# Patient Record
Sex: Female | Born: 1955 | Race: White | Hispanic: No | Smoking: Former smoker
Health system: Southern US, Community
[De-identification: ages and names within clinical notes are randomized; demographics above are authoritative.]

## PROBLEM LIST (undated history)

## (undated) ENCOUNTER — Emergency Department (HOSPITAL_COMMUNITY): Admission: EM | Payer: Medicare Other | Source: Home / Self Care

## (undated) DIAGNOSIS — B1920 Unspecified viral hepatitis C without hepatic coma: Secondary | ICD-10-CM

## (undated) DIAGNOSIS — N289 Disorder of kidney and ureter, unspecified: Secondary | ICD-10-CM

## (undated) DIAGNOSIS — S065XAA Traumatic subdural hemorrhage with loss of consciousness status unknown, initial encounter: Secondary | ICD-10-CM

## (undated) DIAGNOSIS — M069 Rheumatoid arthritis, unspecified: Secondary | ICD-10-CM

## (undated) DIAGNOSIS — A6920 Lyme disease, unspecified: Secondary | ICD-10-CM

## (undated) DIAGNOSIS — K279 Peptic ulcer, site unspecified, unspecified as acute or chronic, without hemorrhage or perforation: Secondary | ICD-10-CM

## (undated) DIAGNOSIS — K7460 Unspecified cirrhosis of liver: Secondary | ICD-10-CM

## (undated) DIAGNOSIS — S065X9A Traumatic subdural hemorrhage with loss of consciousness of unspecified duration, initial encounter: Secondary | ICD-10-CM

## (undated) HISTORY — PX: JOINT REPLACEMENT: SHX530

---

## 2010-05-18 ENCOUNTER — Encounter (INDEPENDENT_AMBULATORY_CARE_PROVIDER_SITE_OTHER): Payer: Self-pay | Admitting: Internal Medicine

## 2010-12-01 ENCOUNTER — Inpatient Hospital Stay (HOSPITAL_COMMUNITY): Admission: EM | Admit: 2010-12-01 | Discharge: 2010-05-23 | Payer: Self-pay | Admitting: Emergency Medicine

## 2011-03-07 ENCOUNTER — Encounter (HOSPITAL_COMMUNITY)
Admission: RE | Admit: 2011-03-07 | Discharge: 2011-03-07 | Disposition: A | Discharge status: Home / Self Care | Payer: Medicare Other | Source: Ambulatory Visit | Attending: Orthopedic Surgery | Admitting: Orthopedic Surgery

## 2011-03-07 LAB — COMPREHENSIVE METABOLIC PANEL
AST: 122 U/L — ABNORMAL HIGH (ref 0–37)
Alkaline Phosphatase: 90 U/L (ref 39–117)
CO2: 26 mEq/L (ref 19–32)
Chloride: 104 mEq/L (ref 96–112)
GFR calc Af Amer: >60 mL/min (ref >60)
GFR calc non Af Amer: >60 mL/min (ref >60)
Potassium: 4.7 mEq/L (ref 3.5–5.1)
Total Bilirubin: 2 mg/dL — ABNORMAL HIGH (ref 0.3–1.2)
Total Protein: 7.7 g/dL (ref 6.0–8.3)

## 2011-03-07 LAB — CBC: RDW: 14.6 % (ref 11.5–15.5)

## 2011-03-07 LAB — PROTIME-INR
INR: 1.19 (ref 0.00–1.49)
Prothrombin Time: 15.3 seconds — ABNORMAL HIGH (ref 11.6–15.2)

## 2011-03-13 LAB — BASIC METABOLIC PANEL
CO2: 24 mEq/L (ref 19–32)
Calcium: 8.9 mg/dL (ref 8.4–10.5)
Chloride: 110 mEq/L (ref 96–112)
GFR calc Af Amer: >60 mL/min (ref >60)
Sodium: 139 mEq/L (ref 135–145)

## 2011-03-13 LAB — COMPREHENSIVE METABOLIC PANEL
ALT: 16 U/L (ref 0–35)
ALT: 17 U/L (ref 0–35)
Albumin: 2.6 g/dL — ABNORMAL LOW (ref 3.5–5.2)
Alkaline Phosphatase: 51 U/L (ref 39–117)
Alkaline Phosphatase: 57 U/L (ref 39–117)
Alkaline Phosphatase: 65 U/L (ref 39–117)
Alkaline Phosphatase: 79 U/L (ref 39–117)
BUN: 14 mg/dL (ref 6–23)
BUN: 3 mg/dL — ABNORMAL LOW (ref 6–23)
BUN: 42 mg/dL — ABNORMAL HIGH (ref 6–23)
Calcium: 7.9 mg/dL — ABNORMAL LOW (ref 8.4–10.5)
Calcium: 8.2 mg/dL — ABNORMAL LOW (ref 8.4–10.5)
Calcium: 8.7 mg/dL (ref 8.4–10.5)
Calcium: 9.3 mg/dL (ref 8.4–10.5)
Creatinine, Ser: 0.45 mg/dL (ref 0.4–1.2)
Creatinine, Ser: 0.55 mg/dL (ref 0.4–1.2)
Creatinine, Ser: 0.8 mg/dL (ref 0.4–1.2)
GFR calc non Af Amer: >60 mL/min (ref >60)
Glucose, Bld: 128 mg/dL — ABNORMAL HIGH (ref 70–99)
Glucose, Bld: 83 mg/dL (ref 70–99)
Glucose, Bld: 98 mg/dL (ref 70–99)
Potassium: 3.8 mEq/L (ref 3.5–5.1)
Potassium: 4.1 mEq/L (ref 3.5–5.1)
Potassium: 4.1 mEq/L (ref 3.5–5.1)
Sodium: 137 mEq/L (ref 135–145)
Sodium: 137 mEq/L (ref 135–145)
Total Bilirubin: 1.4 mg/dL — ABNORMAL HIGH (ref 0.3–1.2)
Total Protein: 5.9 g/dL — ABNORMAL LOW (ref 6.0–8.3)
Total Protein: 5.9 g/dL — ABNORMAL LOW (ref 6.0–8.3)
Total Protein: 6.4 g/dL (ref 6.0–8.3)
Total Protein: 8.2 g/dL (ref 6.0–8.3)

## 2011-03-13 LAB — DIFFERENTIAL
Basophils Relative: 1 % (ref 0–1)
Lymphocytes Relative: 32 % (ref 12–46)
Lymphs Abs: 3 10*3/uL (ref 0.7–4.0)
Monocytes Absolute: 1.4 10*3/uL — ABNORMAL HIGH (ref 0.1–1.0)
Neutro Abs: 5 10*3/uL (ref 1.7–7.7)
Neutro Abs: 7.7 10*3/uL (ref 1.7–7.7)
Neutrophils Relative %: 62 % (ref 43–77)

## 2011-03-13 LAB — URINALYSIS, ROUTINE W REFLEX MICROSCOPIC
Glucose, UA: NEGATIVE mg/dL
Ketones, ur: NEGATIVE mg/dL
Protein, ur: NEGATIVE mg/dL
Urobilinogen, UA: 1 mg/dL (ref 0.0–1.0)

## 2011-03-13 LAB — URINE CULTURE

## 2011-03-13 LAB — TYPE AND SCREEN

## 2011-03-13 LAB — CBC
HCT: 24.2 % — ABNORMAL LOW (ref 36.0–46.0)
HCT: 24.8 % — ABNORMAL LOW (ref 36.0–46.0)
HCT: 25.2 % — ABNORMAL LOW (ref 36.0–46.0)
HCT: 25.7 % — ABNORMAL LOW (ref 36.0–46.0)
HCT: 26.4 % — ABNORMAL LOW (ref 36.0–46.0)
HCT: 27.1 % — ABNORMAL LOW (ref 36.0–46.0)
HCT: 30.3 % — ABNORMAL LOW (ref 36.0–46.0)
Hemoglobin: 10 g/dL — ABNORMAL LOW (ref 12.0–15.0)
Hemoglobin: 8.3 g/dL — ABNORMAL LOW (ref 12.0–15.0)
Hemoglobin: 8.7 g/dL — ABNORMAL LOW (ref 12.0–15.0)
Hemoglobin: 8.8 g/dL — ABNORMAL LOW (ref 12.0–15.0)
Hemoglobin: 8.9 g/dL — ABNORMAL LOW (ref 12.0–15.0)
Hemoglobin: 9 g/dL — ABNORMAL LOW (ref 12.0–15.0)
Hemoglobin: 9.3 g/dL — ABNORMAL LOW (ref 12.0–15.0)
MCHC: 33 g/dL (ref 30.0–36.0)
MCHC: 33.3 g/dL (ref 30.0–36.0)
MCHC: 33.3 g/dL (ref 30.0–36.0)
MCHC: 33.8 g/dL (ref 30.0–36.0)
MCHC: 34.1 g/dL (ref 30.0–36.0)
MCHC: 34.1 g/dL (ref 30.0–36.0)
MCHC: 34.1 g/dL (ref 30.0–36.0)
MCHC: 34.7 g/dL (ref 30.0–36.0)
MCV: 102 fL — ABNORMAL HIGH (ref 78.0–100.0)
MCV: 102.9 fL — ABNORMAL HIGH (ref 78.0–100.0)
MCV: 103.5 fL — ABNORMAL HIGH (ref 78.0–100.0)
MCV: 104 fL — ABNORMAL HIGH (ref 78.0–100.0)
MCV: 104.5 fL — ABNORMAL HIGH (ref 78.0–100.0)
MCV: 105.2 fL — ABNORMAL HIGH (ref 78.0–100.0)
Platelets: 102 10*3/uL — ABNORMAL LOW (ref 150–400)
Platelets: 78 10*3/uL — ABNORMAL LOW (ref 150–400)
Platelets: 79 10*3/uL — ABNORMAL LOW (ref 150–400)
Platelets: 94 10*3/uL — ABNORMAL LOW (ref 150–400)
RBC: 2.35 MIL/uL — ABNORMAL LOW (ref 3.87–5.11)
RBC: 2.48 MIL/uL — ABNORMAL LOW (ref 3.87–5.11)
RBC: 2.51 MIL/uL — ABNORMAL LOW (ref 3.87–5.11)
RBC: 2.53 MIL/uL — ABNORMAL LOW (ref 3.87–5.11)
RBC: 2.56 MIL/uL — ABNORMAL LOW (ref 3.87–5.11)
RDW: 15.5 % (ref 11.5–15.5)
RDW: 16.3 % — ABNORMAL HIGH (ref 11.5–15.5)
RDW: 16.5 % — ABNORMAL HIGH (ref 11.5–15.5)
RDW: 16.6 % — ABNORMAL HIGH (ref 11.5–15.5)
RDW: 16.7 % — ABNORMAL HIGH (ref 11.5–15.5)
WBC: 12.3 10*3/uL — ABNORMAL HIGH (ref 4.0–10.5)
WBC: 4.7 10*3/uL (ref 4.0–10.5)
WBC: 4.9 10*3/uL (ref 4.0–10.5)

## 2011-03-13 LAB — LIPASE, BLOOD: Lipase: 47 U/L (ref 11–59)

## 2011-03-13 LAB — ETHANOL: Alcohol, Ethyl (B): 15 mg/dL — ABNORMAL HIGH (ref 0–10)

## 2011-03-13 LAB — RAPID URINE DRUG SCREEN, HOSP PERFORMED
Barbiturates: NOT DETECTED
Benzodiazepines: POSITIVE — AB
Opiates: POSITIVE — AB

## 2011-03-13 LAB — TSH: TSH: <0.004 u[IU]/mL — ABNORMAL LOW (ref 0.350–4.500)

## 2011-03-13 LAB — RETICULOCYTES: Retic Ct Pct: 3.7 % — ABNORMAL HIGH (ref 0.4–3.1)

## 2011-03-13 LAB — IRON AND TIBC
Iron: 48 ug/dL (ref 42–135)
Saturation Ratios: 20 % (ref 20–55)

## 2011-03-13 LAB — URINE MICROSCOPIC-ADD ON

## 2011-03-13 LAB — PROTIME-INR
INR: 1.47 (ref 0.00–1.49)
Prothrombin Time: 17.7 seconds — ABNORMAL HIGH (ref 11.6–15.2)

## 2011-03-13 LAB — VITAMIN B12: Vitamin B-12: 1003 pg/mL — ABNORMAL HIGH (ref 211–911)

## 2011-03-13 LAB — GLUCOSE, CAPILLARY: Glucose-Capillary: 96 mg/dL (ref 70–99)

## 2011-03-13 LAB — APTT: aPTT: 32 seconds (ref 24–37)

## 2011-03-14 ENCOUNTER — Inpatient Hospital Stay (HOSPITAL_COMMUNITY): Payer: Medicare Other

## 2011-03-14 ENCOUNTER — Inpatient Hospital Stay (HOSPITAL_COMMUNITY)
Admission: RE | Admit: 2011-03-14 | Discharge: 2011-03-18 | DRG: 470 | Disposition: A | Discharge status: Home Health | Payer: Medicare Other | Source: Ambulatory Visit | Attending: Orthopedic Surgery | Admitting: Orthopedic Surgery

## 2011-03-14 DIAGNOSIS — Z01810 Encounter for preprocedural cardiovascular examination: Secondary | ICD-10-CM

## 2011-03-14 DIAGNOSIS — F17200 Nicotine dependence, unspecified, uncomplicated: Secondary | ICD-10-CM | POA: Diagnosis present

## 2011-03-14 DIAGNOSIS — Z01812 Encounter for preprocedural laboratory examination: Secondary | ICD-10-CM

## 2011-03-14 DIAGNOSIS — F10939 Alcohol use, unspecified with withdrawal, unspecified: Secondary | ICD-10-CM | POA: Diagnosis present

## 2011-03-14 DIAGNOSIS — Z8619 Personal history of other infectious and parasitic diseases: Secondary | ICD-10-CM

## 2011-03-14 DIAGNOSIS — F10239 Alcohol dependence with withdrawal, unspecified: Secondary | ICD-10-CM | POA: Diagnosis present

## 2011-03-14 DIAGNOSIS — M1710 Unilateral primary osteoarthritis, unspecified knee: Principal | ICD-10-CM | POA: Diagnosis present

## 2011-03-14 DIAGNOSIS — F1010 Alcohol abuse, uncomplicated: Secondary | ICD-10-CM | POA: Diagnosis present

## 2011-03-14 DIAGNOSIS — D62 Acute posthemorrhagic anemia: Secondary | ICD-10-CM | POA: Diagnosis not present

## 2011-03-14 DIAGNOSIS — IMO0002 Reserved for concepts with insufficient information to code with codable children: Principal | ICD-10-CM | POA: Diagnosis present

## 2011-03-14 LAB — ABO/RH: ABO/RH(D): A POS

## 2011-03-14 LAB — HEPATIC FUNCTION PANEL
ALT: 31 U/L (ref 0–35)
Indirect Bilirubin: 1.2 mg/dL — ABNORMAL HIGH (ref 0.3–0.9)
Total Protein: 7.5 g/dL (ref 6.0–8.3)

## 2011-03-15 LAB — PROTIME-INR
INR: 1.49 (ref 0.00–1.49)
Prothrombin Time: 18.2 seconds — ABNORMAL HIGH (ref 11.6–15.2)

## 2011-03-15 LAB — BASIC METABOLIC PANEL
Calcium: 8.2 mg/dL — ABNORMAL LOW (ref 8.4–10.5)
GFR calc Af Amer: >60 mL/min (ref >60)
GFR calc non Af Amer: >60 mL/min (ref >60)
Potassium: 4.3 mEq/L (ref 3.5–5.1)
Sodium: 133 mEq/L — ABNORMAL LOW (ref 135–145)

## 2011-03-15 LAB — CBC
Hemoglobin: 7.9 g/dL — ABNORMAL LOW (ref 12.0–15.0)
RBC: 2.3 MIL/uL — ABNORMAL LOW (ref 3.87–5.11)
WBC: 11 10*3/uL — ABNORMAL HIGH (ref 4.0–10.5)

## 2011-03-15 LAB — PREPARE RBC (CROSSMATCH)

## 2011-03-16 LAB — BASIC METABOLIC PANEL
BUN: 6 mg/dL (ref 6–23)
Calcium: 8.4 mg/dL (ref 8.4–10.5)
GFR calc non Af Amer: >60 mL/min (ref >60)
Potassium: 4.5 mEq/L (ref 3.5–5.1)
Sodium: 134 mEq/L — ABNORMAL LOW (ref 135–145)

## 2011-03-16 LAB — TYPE AND SCREEN
ABO/RH(D): A POS
Unit division: 0

## 2011-03-16 LAB — PROTIME-INR
INR: 1.56 — ABNORMAL HIGH (ref 0.00–1.49)
Prothrombin Time: 18.9 seconds — ABNORMAL HIGH (ref 11.6–15.2)

## 2011-03-16 LAB — CBC
Hemoglobin: 9.4 g/dL — ABNORMAL LOW (ref 12.0–15.0)
MCHC: 34.8 g/dL (ref 30.0–36.0)
Platelets: 68 10*3/uL — ABNORMAL LOW (ref 150–400)
RBC: 2.86 MIL/uL — ABNORMAL LOW (ref 3.87–5.11)

## 2011-03-17 LAB — CBC
HCT: 24.6 % — ABNORMAL LOW (ref 36.0–46.0)
MCHC: 35.4 g/dL (ref 30.0–36.0)
RDW: 17.8 % — ABNORMAL HIGH (ref 11.5–15.5)
WBC: 12.1 10*3/uL — ABNORMAL HIGH (ref 4.0–10.5)

## 2011-03-17 LAB — PROTIME-INR
INR: 1.51 — ABNORMAL HIGH (ref 0.00–1.49)
Prothrombin Time: 18.4 seconds — ABNORMAL HIGH (ref 11.6–15.2)

## 2011-03-17 LAB — BASIC METABOLIC PANEL
BUN: 7 mg/dL (ref 6–23)
Calcium: 8.4 mg/dL (ref 8.4–10.5)
Chloride: 100 mEq/L (ref 96–112)
Creatinine, Ser: 0.48 mg/dL (ref 0.4–1.2)
GFR calc Af Amer: >60 mL/min (ref >60)
GFR calc non Af Amer: >60 mL/min (ref >60)

## 2011-03-17 NOTE — Op Note (Signed)
Andrea Rodriguez            ACCOUNT NO.:  [id number]  MEDICAL RECORD NO.:  [id number]           PATIENT TYPE:  I  LOCATION:  2550                         FACILITY:  [facility]  PHYSICIAN:  Eulas Post, MD    DATE OF BIRTH:  11-Apr-1956  DATE OF PROCEDURE:  03/14/2011 DATE OF DISCHARGE:                              OPERATIVE REPORT   PREOPERATIVE DIAGNOSES: 1. Left posttraumatic knee arthritis. 2. Retained hardware, left knee. 3. Retained hardware, right medial ankle. 4. Retained hardware, right lateral ankle.  OPERATIVE PROCEDURE: 1. Left total knee arthroplasty. 2. Left knee removal of hardware, deep. 3. Removal of hardware, medial ankle. 4. Removal of hardware, lateral ankle.  ATTENDING SURGEON:  Eulas Post, MD.  FIRST ASSISTANT:  Janace Litten, OPA  PREOPERATIVE INDICATIONS:  [name], [name], presented with complaints regarding severe left knee pain and inability to walk with end-stage degenerative changes found.  She failed conservative measures and elected to undergo left total knee replacement.  She also complained of prominent hardware on the right ankle after an ankle ORIF.  She wished to have all of the hardware removed.  The risks, benefits, and alternatives were discussed before the procedure including, but not limited to the risks of infection, bleeding, nerve injury, periprosthetic fracture, the need for revision surgery, incomplete relief of knee pain, cardiopulmonary complications, blood clots, among others and she was willing to proceed.  OPERATIVE IMPLANTS:  I used DePuy PFC Sigma fixed bearing posterior stabilized size 2.5 femur, size 3 tibia, size 10 mm polyethylene insert with a 32 mm oval dome patella, and two bags of SmartSet HV bone cement.  OPERATIVE PROCEDURE:  The patient was brought to the operating room, placed in supine position.  IV antibiotics were given.  General anesthesia administered.   Bilateral lower extremities were prepped and draped in the usual sterile fashion.  I started with the left knee.  The leg was elevated, exsanguinated, tourniquet was inflated.  Time-out was performed.  She had a previous open incision, which I utilized in order to minimize an accessory scar.  This forced my total knee incision to be a curvilinear medial incision.  Dissection was carried down and medial parapatellar approach was performed.  I then everted the patella. Synovectomy was performed.  The menisci were removed.  The ACL was actually still intact and the fibers had interdigitated into the bone. I did release the MCL partially around the proximal superomedial aspect.  Once all of the osteophytes had been removed, there was a severe extensive grade 4 changes particularly on the medial femoral condyle and some on the patellofemoral joint as well.  The lateral side was in better condition.  I then opened the distal femur and resected the distal femur using intramedullary jig that was set at 5 degrees left with taking off 11 mm of bone.  This cut directly down into the notch.  The distal condyles were removed.  I then used the extramedullary tibial jig and protected my cruciate ligament and then resected my tibia taking slightly more off to lateral side and then off to medial side.  The  ACL and PCL were released.  I then applied my 4-in-1 cutting jig after sizing the femur.  Initially, I had selected a size 3, and I resected my femur.  I measured my extension gap which was 10, although my flexion gap was fairly tight. Therefore, I then went to a 2.5 on the femur and reapplied the 4-in-1 cutting jig for the 2.5 femur and then performed my cuts.  This improved the gap and balanced both flexion and extension.  I then prepared my tibia and upon preparation of the tibia, it became evident that the metallic screw was in the way for the tibial implant, and so this had to be removed.   This was a preexisting metallic screw from an ACL reconstruction from a long go.  Initially,  I tried to get the screw out distally, however, I was concerned regarding the cortical integrity of the bone and so therefore I did not expose the screw on the tibial cortex.  There was still the hole where the previous remnant ACL tunnel had been.  I actually was able to free the screw within the bone of the proximal tibia within the canal itself and then removed the screw without difficulty through the canal.  I then completed the preparation of the tibial baseplate and then placed trial components.  The gaps were actually well balanced, and at this point with all of the components in place it balanced better to a 10.  A 12 poly was too tight.  Therefore, I turned my attention to the patella.  The patella measured 23 mm and after resection measured 15.  I drilled the patellar lug holes and then trialed and had excellent no thumb tracking.  I then removed all the instruments and the trials, and then cemented the real prosthesis into place.  I trialed with a 10-mm polyethylene and found to have excellent tracking and appropriate alignment and good balance of flexion and extension gaps.  Therefore, a 10 mm real insert was utilized.  The knee went out to full extension, but not hyperextension.  I then irrigated the wounds copiously and repaired the parapatellar tissue with Vicryl followed by Vicryl for the subcutaneous tissue and 4-0 Monocryl with Steri-Strips and sterile gauze for the skin.  I then turned my attention to the right ankle.  Incision was made through the previous medial incision and the medial screw was identified and removed.  This was actually fairly challenging because at the shank portion of the screw, the bone had grown over and the threads on the screw would not cut into the newly formed bone.  Therefore, I had to somewhat burrow a tunnel along the shank in order to get  the thread to advance out.  I was ultimately able to do this with a small curette. The screw then was removed in one piece.  The lateral side was incised sharply and along her previous incision, and all of the screws in the fibular plate was removed along with the lag screw.  This was done without difficulty.  These wounds were then irrigated and closed with Vicryl and Monocryl and Steri-Strips and sterile gauze.  The patient was awakened and returned to the PACU in stable and satisfactory condition.  Total tourniquet time was approximately 2 hours on the left side and I did not use one for the right side.  Janace Litten, orthopedic P.A.-C. was present and scrubbed throughout the case and critical for completion with assistance of both retraction as well  as exposure and positioning, and he also assisted with closure.     Eulas Post, MD     JPL/MEDQ  D:  03/14/2011  T:  03/14/2011  Job:  [id number]  Electronically Signed by Teryl Lucy MD on 03/17/2011 03:21:43 PM

## 2011-03-18 LAB — URINALYSIS, ROUTINE W REFLEX MICROSCOPIC
Glucose, UA: NEGATIVE mg/dL
Ketones, ur: 15 mg/dL — AB
Nitrite: NEGATIVE
Protein, ur: NEGATIVE mg/dL
Urobilinogen, UA: 1 mg/dL (ref 0.0–1.0)

## 2011-03-18 LAB — URINE MICROSCOPIC-ADD ON

## 2011-03-19 LAB — URINE CULTURE
Colony Count: 75000
Culture  Setup Time: 201203241217
Special Requests: NEGATIVE

## 2011-03-26 ENCOUNTER — Emergency Department (HOSPITAL_COMMUNITY)
Admission: EM | Admit: 2011-03-26 | Discharge: 2011-03-27 | Disposition: A | Discharge status: Home / Self Care | Payer: Medicare Other | Attending: Emergency Medicine | Admitting: Emergency Medicine

## 2011-03-26 ENCOUNTER — Emergency Department (HOSPITAL_COMMUNITY): Payer: Medicare Other

## 2011-03-26 DIAGNOSIS — R404 Transient alteration of awareness: Secondary | ICD-10-CM | POA: Insufficient documentation

## 2011-03-26 DIAGNOSIS — M25473 Effusion, unspecified ankle: Secondary | ICD-10-CM | POA: Insufficient documentation

## 2011-03-26 DIAGNOSIS — N390 Urinary tract infection, site not specified: Secondary | ICD-10-CM | POA: Insufficient documentation

## 2011-03-26 DIAGNOSIS — M25579 Pain in unspecified ankle and joints of unspecified foot: Secondary | ICD-10-CM | POA: Insufficient documentation

## 2011-03-26 DIAGNOSIS — R110 Nausea: Secondary | ICD-10-CM | POA: Insufficient documentation

## 2011-03-26 DIAGNOSIS — Z96659 Presence of unspecified artificial knee joint: Secondary | ICD-10-CM | POA: Insufficient documentation

## 2011-03-26 DIAGNOSIS — L02419 Cutaneous abscess of limb, unspecified: Secondary | ICD-10-CM | POA: Insufficient documentation

## 2011-03-26 DIAGNOSIS — R609 Edema, unspecified: Secondary | ICD-10-CM | POA: Insufficient documentation

## 2011-03-26 DIAGNOSIS — W010XXA Fall on same level from slipping, tripping and stumbling without subsequent striking against object, initial encounter: Secondary | ICD-10-CM | POA: Insufficient documentation

## 2011-03-26 DIAGNOSIS — Y92009 Unspecified place in unspecified non-institutional (private) residence as the place of occurrence of the external cause: Secondary | ICD-10-CM | POA: Insufficient documentation

## 2011-03-26 DIAGNOSIS — Z79899 Other long term (current) drug therapy: Secondary | ICD-10-CM | POA: Insufficient documentation

## 2011-03-26 DIAGNOSIS — L03119 Cellulitis of unspecified part of limb: Secondary | ICD-10-CM | POA: Insufficient documentation

## 2011-03-26 DIAGNOSIS — R509 Fever, unspecified: Secondary | ICD-10-CM | POA: Insufficient documentation

## 2011-03-26 DIAGNOSIS — R51 Headache: Secondary | ICD-10-CM | POA: Insufficient documentation

## 2011-03-26 DIAGNOSIS — M25476 Effusion, unspecified foot: Secondary | ICD-10-CM | POA: Insufficient documentation

## 2011-03-26 DIAGNOSIS — M25569 Pain in unspecified knee: Secondary | ICD-10-CM | POA: Insufficient documentation

## 2011-03-26 LAB — BASIC METABOLIC PANEL
GFR calc non Af Amer: >60 mL/min (ref >60)
Potassium: 4.1 mEq/L (ref 3.5–5.1)
Sodium: 132 mEq/L — ABNORMAL LOW (ref 135–145)

## 2011-03-26 LAB — URINE MICROSCOPIC-ADD ON

## 2011-03-26 LAB — DIFFERENTIAL
Basophils Absolute: 0.1 10*3/uL (ref 0.0–0.1)
Eosinophils Absolute: 0.2 10*3/uL (ref 0.0–0.7)
Eosinophils Relative: 2 % (ref 0–5)
Lymphocytes Relative: 30 % (ref 12–46)

## 2011-03-26 LAB — URINALYSIS, ROUTINE W REFLEX MICROSCOPIC
Bilirubin Urine: NEGATIVE
Hgb urine dipstick: NEGATIVE
Ketones, ur: 15 mg/dL — AB
Nitrite: NEGATIVE
Specific Gravity, Urine: 1.025 (ref 1.005–1.030)
Urobilinogen, UA: 1 mg/dL (ref 0.0–1.0)

## 2011-03-26 LAB — CBC
HCT: 32.6 % — ABNORMAL LOW (ref 36.0–46.0)
Platelets: 237 10*3/uL (ref 150–400)
RDW: 18.1 % — ABNORMAL HIGH (ref 11.5–15.5)
WBC: 6.9 10*3/uL (ref 4.0–10.5)

## 2011-03-28 LAB — URINE CULTURE
Colony Count: >=100000
Culture  Setup Time: 201204020330

## 2011-03-30 NOTE — Discharge Summary (Signed)
  [name], KE            ACCOUNT NO.:  [id number]  MEDICAL RECORD NO.:  [id number]           PATIENT TYPE:  I  LOCATION:  5003                         FACILITY:  [facility]  PHYSICIAN:  Eulas Post, MD    DATE OF BIRTH:  1956-06-13  DATE OF ADMISSION:  03/14/2011 DATE OF DISCHARGE:  03/18/2011                              DISCHARGE SUMMARY   ADMISSION DIAGNOSES: 1. Left post-traumatic knee arthritis. 2. Right retained hardware in ankle. 3. Acute postoperative blood loss anemia. 4. History of substantial alcohol abuse.  PRIMARY PROCEDURE:  Left total knee arthroplasty and right ankle removal of hardware.  HOSPITAL COURSE:  [name] is a woman who had a left knee ACL reconstruction with subsequent post-traumatic arthritis.  She also had a right ankle fracture with symptomatic  hardware.  She elected for hardware removal as well as total knee replacement on the left side. She tolerated the procedure well and postoperatively did not have any complications.  She was given perioperative antibiotics for antimicrobial prophylaxis.  She was given sequential compression devices and Coumadin for DVT prophylaxis.  Her dressings were changed and her wounds were clean.  She did have some periods of confusion and was on a CIWA protocol.  These improved.  Ultimately, I did also order alcohol to the bedside because she continued to have confusion and did not seem to be responding.  She ultimately stabilized and returned back to her baseline and is planned to be discharged home with followup with me in 2 weeks.  There were no complications.  She benefited maximally from her hospital stay.     Eulas Post, MD     JPL/MEDQ  D:  03/28/2011  T:  03/29/2011  Job:  [id number]  Electronically Signed by Teryl Lucy MD on 03/30/2011 11:28:27 AM

## 2011-04-02 NOTE — Consult Note (Signed)
Andrea Rodriguez, Andrea Rodriguez            ACCOUNT NO.:  [id number]  MEDICAL RECORD NO.:  [id number]           PATIENT TYPE:  E  LOCATION:  [facility]                         FACILITY:  [facility]  PHYSICIAN:  Doralee Albino. Carola Frost, M.D. DATE OF BIRTH:  02/07/1956  DATE OF CONSULTATION:  03/27/2011 DATE OF DISCHARGE:  03/27/2011                                CONSULTATION   REQUESTING PROVIDER:  Wayland Salinas, MD  REASON FOR CONSULTATION:  Postsurgical wound infection.  PRIMARY ORTHOPEDIC DOCTOR:  Eulas Post, MD  HISTORY OF PRESENT ILLNESS:  Andrea Rodriguez is a 55 year old female status post left total knee arthroplasty revision and hardware removal of right ankle on March 14, 2011.  The patient was discharged to home on March 18, 2011, and has been with her mom, with her son helping take care of her since that time.  The patient presented to the ED late March 26, 2011, with complaints of left knee pain and erythema to right ankle. The patient does also report falling last night hitting her left knee. Again, as noted previously, the patient lives with her mother and her son has an helping take care of her, but he lives in [place]. Presently, the patient is in room #1, not really complaining of left knee pain, but notes right ankle drainage.  Further increase finds that the patient has been removing her Steri-Strips on her own and changing them, layering them over top of each other thereby preventing sufficient drainage.  The patient denies any chest pain.  No nausea or vomiting. No headaches.  No shortness of breath at current time.  The patient notes that she does have an appointment with Dr. Dion Saucier at the end of the week.  The patient is also requesting more pain medication as well.  PAST MEDICAL HISTORY:  Notable for upper GI bleeding secondary to gastric and duodenal ulcers, EtOH dependence, history of cirrhosis with portal hypertension, status post TIPS, tobacco abuse, and history  of hepatitis C.  PAST SURGICAL HISTORY:  Notable for an appendectomy, history of subdural hematoma evacuation, and TIPS procedure.  MEDICATIONS: 1. Omega-3. 2. Vitamin D3. 3. Vitamin C 500 mg p.o. daily. 4. Valium 5 mg 1 q.6 h. as needed. 5. Thiamine 100 mg 1 p.o. daily. 6. Phenergan 25 mg 1 p.o. q.6 h. as needed. 7. Oxycodone 5 mg 1-2 p.o. q.4 h. as needed for pain. 8. Multivitamins p.o. daily. 9. Magnesium oxide 800 mg 1 p.o. daily. 10.Lovenox 40 mg 1 subcutaneous injection daily. 11.Gabapentin 300 mg 1 p.o. b.i.d. 12.Calcium carbonate with vitamin D 1 p.o. daily.  ALLERGIES:  The patient reports allergy to MORPHINE.  SOCIAL HISTORY:  The patient smokes and drinks alcohol on a regular basis.  Denies any additional illicit drug use.  She lives with her mom and her son helps take care of her.  REVIEW OF SYSTEMS:  As noted above in the HPI.  PHYSICAL EXAMINATION:  VITAL SIGNS:  Temperature 97.7, heart rate 74, respirations 18, at 99% on room air, and BP is 115/71. GENERAL:  The patient in no acute distress, appears well and nonseptic appearing. HEENT:  Atraumatic.  Extraocular muscles  are intact.  Moist mucous membranes are noted. LUNGS:  Decreased at the bases, otherwise clear. CARDIAC:  S1 and S2 noted. ABDOMEN:  Positive bowel sounds.  Nontender. LEFT LOWER EXTREMITY:  Left knee with moderate effusion, not bed. Temperature feels appropriate.  Knee range of motion is satisfactory as the patient is able to achieve about negative 3 degrees to full extension.  Incision is not red.  No drainage.  The wound is noted to be healing well. NEUROLOGIC:  Distal sensory function along the deep peroneal nerve, superficial peroneal nerve, and tibial nerve are intact.  Femoral nerve sensory functions are intact.  EHL, FHL, anterior tibialis, posterior tibialis, peroneals, gastroc-soleus complex, quadriceps, and hamstring motor functions are intact.  Hip flexor motor function is intact  as well.  Swelling is appropriate and controlled and is not asymmetric. Palpable dorsalis pedis pulse appreciated.  No deep calf tenderness is noted.  Compartments are soft and nontender. RIGHT LOWER EXTREMITY:  Right ankle with lateral AND medial wounds. Lateral wound looks fantastic.  Steri-Strips are covering the wound. Medial wound also has Steri-Strips laid over top of each other making an occlusive environment.  Minimal erythema is noted.  Area is outlined. No gross purulence is noted as well.  Deep peroneal nerve, superficial peroneal nerve, and tibial nerve sensory function are intact.  EHL, FHL, anterior tibialis, posterior tibialis, peroneals, and gastroc-soleus complex motor functions intact.  Palpable dorsalis pedis pulse noted. No deep calf tenderness is noted.  Compartments are soft and nontender as well.  LABORATORY DATA:  Sodium 132, potassium 4.1, chloride 100, bicarb 26, BUN 7, creatinine 0.55, and glucose 137.  Hemoglobin 10.2, hematocrit 32.6, platelets 237, and white blood cells 6.9.  UA demonstrates small leukocytes and is cloudy.  Urine microscopy positive for many bacteria, few squamous epithelial cells.  ASSESSMENT/PLAN:  This is a 55 year old female status post left total knee arthroplasty revision on March 14, 2011. 1. Left total knee arthroplasty revision, stable.  Continue with CPM.     Weight-bear as tolerated.  No pillows under the knee.  Continue to     wash the wounds with soap and water.  No more Steri-Strips. 2. Superficial right ankle medial wound infection.  Discharge home     with p.o. antibiotics.  No more Steri-Strips, needs to air out.     Weight-bear as tolerated.  Dressing was as needed for drainage. 3. Possible urinary tract infection.  UA is positive with bacteria on     micro.  May be contributing to overall picture.  Will be use Cipro     500 mg p.o. b.i.d. for #2 and #3. 4. Hyponatremia.  Sodium is better than at the time of discharge  on     February 17, 2011, which was at 130.  Suspect that this is     secondary to chronic alcohol use and poor oral intake, so called     bread and beer potomania, currently appear to be asymptomatic     without any negative sequela. 5. Pain.  OxyIR 5 mg 2 p.o. q.6 h., #30 were given. 6. Disposition.  Discharge to home with oral antibiotics and     additional pain medication.  The patient is scheduled to follow up     with Dr. Dion Saucier at the end of this week.     Mearl Latin, PA   ______________________________ Doralee Albino. Carola Frost, M.D.    KWP/MEDQ  D:  03/27/2011  T:  03/28/2011  Job:  [id number]  cc:   Eulas Post, MD  Electronically Signed by Montez Morita PA on 03/29/2011 12:14:46 PM Electronically Signed by Myrene Galas M.D. on 04/02/2011 03:24:16 PM

## 2011-07-06 IMAGING — CT CT ABD-PELV W/ CM
2 of 5 series · 16 of 46 positions shown, 18 images · IV contrast (omnipaque)
Comparison: Abdominal radiograph performed 05/17/2010

CLINICAL DATA: Nausea, vomiting and diarrhea; coffee ground emesis
and dark stools.  Abdominal pain.  Mild leukocytosis.  History of
gastric ulcers and TIPS.

CT ABDOMEN AND PELVIS WITH CONTRAST
TECHNIQUE: Multidetector CT imaging of the abdomen and pelvis was
performed following the standard protocol during bolus
administration of intravenous contrast.
Contrast: 100 mL of Omnipaque 300 IV contrast

[Series 2: rtn ap with st · axial · 0.60mm/px · z∈[-690,-310]mm · 13 of 86 slices shown, 15 images]
[im 5/86  soft-tissue]
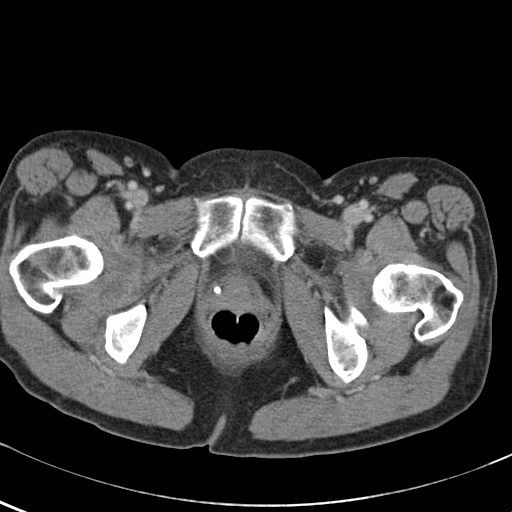
[im 5/86  bone]
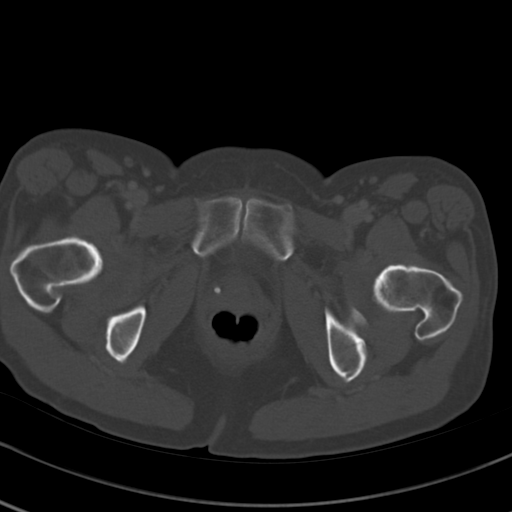
[im 13/86  soft-tissue]
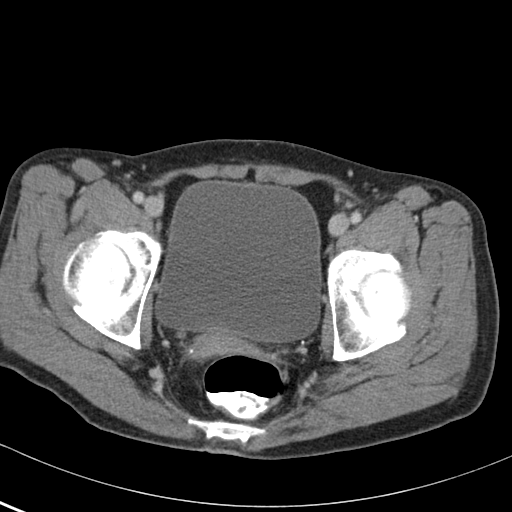
[im 18/86  soft-tissue]
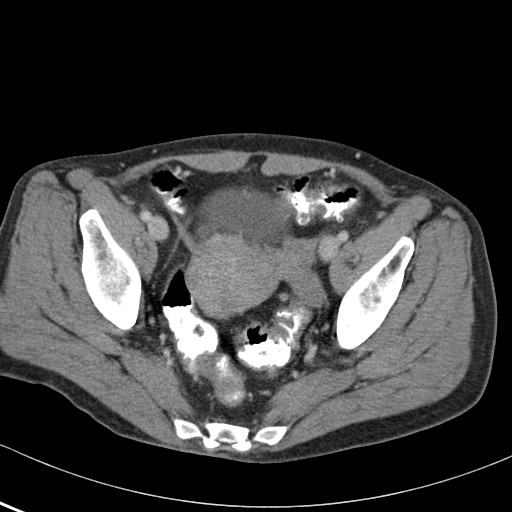
[im 26/86  soft-tissue]
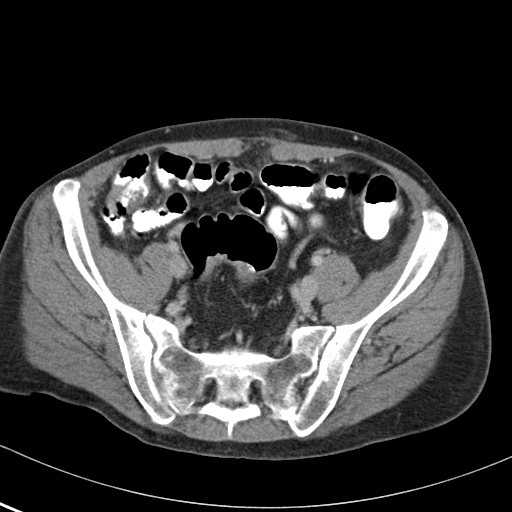
[im 30/86  soft-tissue]
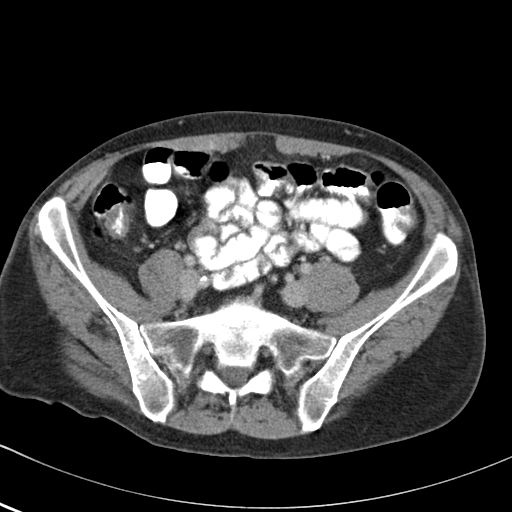
[im 39/86  soft-tissue]
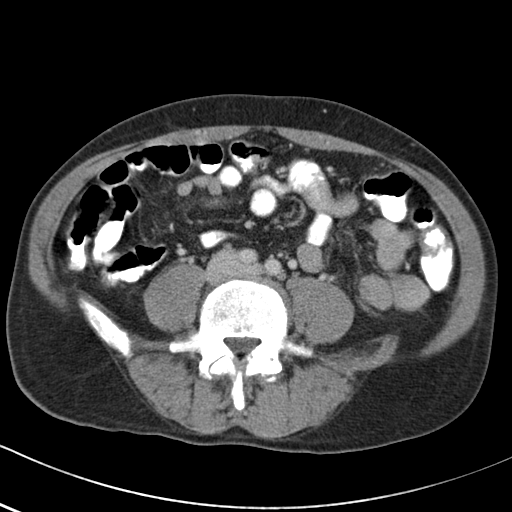
[im 43/86  soft-tissue]
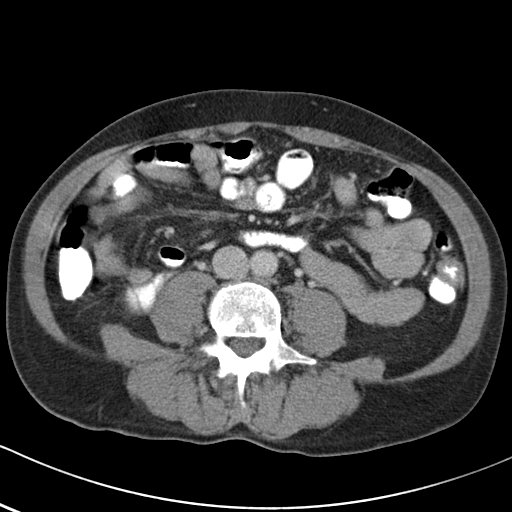
[im 47/86  soft-tissue]
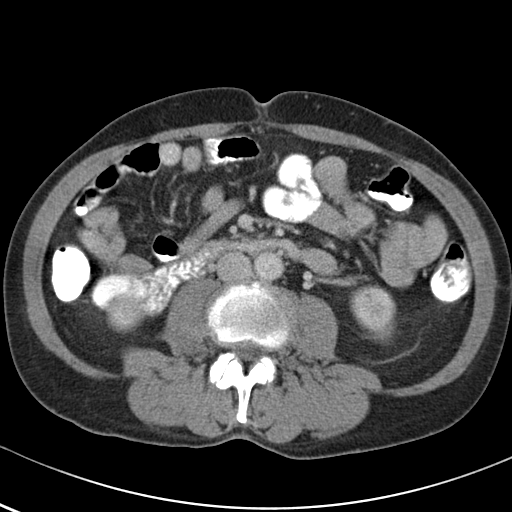
[im 56/86  soft-tissue]
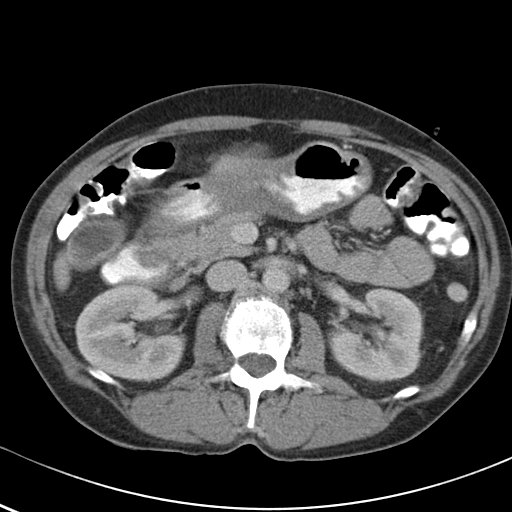
[im 56/86  bone]
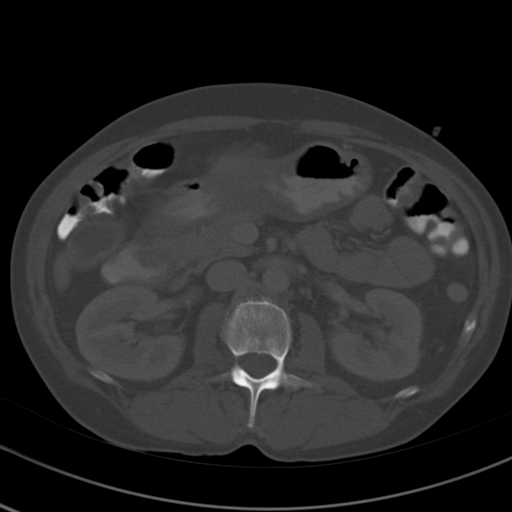
[im 60/86  soft-tissue]
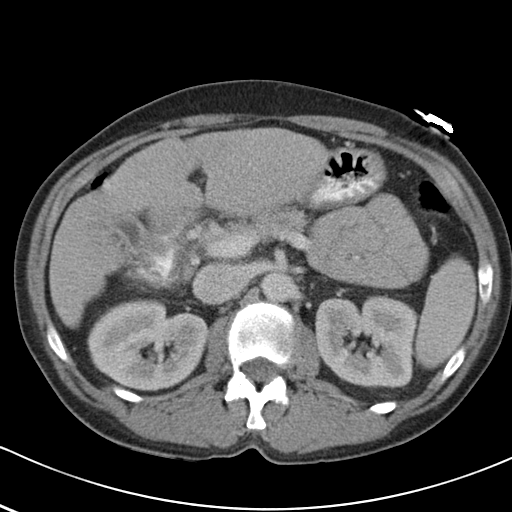
[im 69/86  soft-tissue]
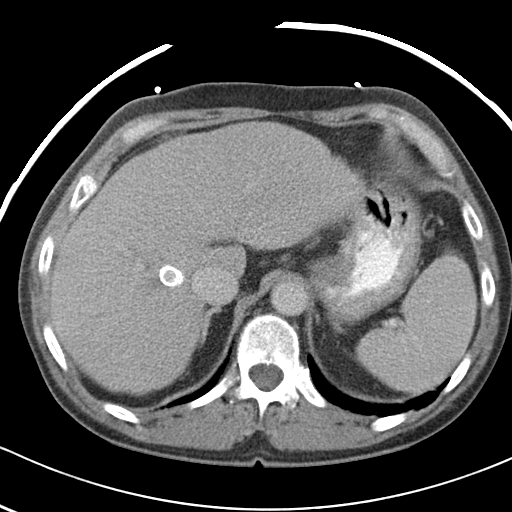
[im 73/86  soft-tissue]
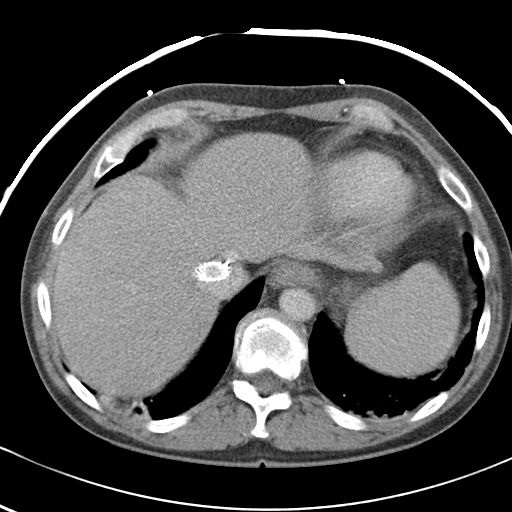
[im 81/86  soft-tissue]
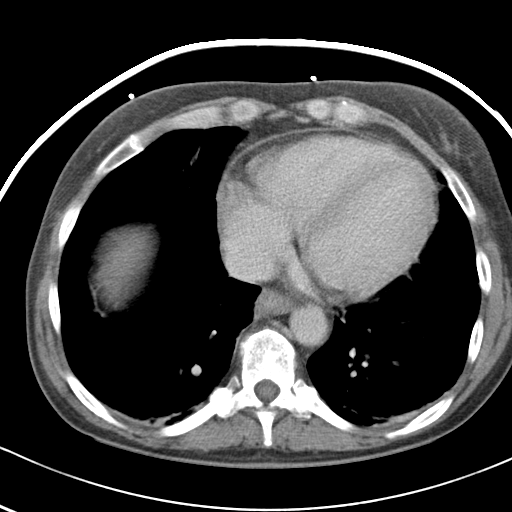

[Series 602: <mpr thick range> · coronal · 0.87mm/px · 3 of 69 slices shown]
[im 23/69  soft-tissue]
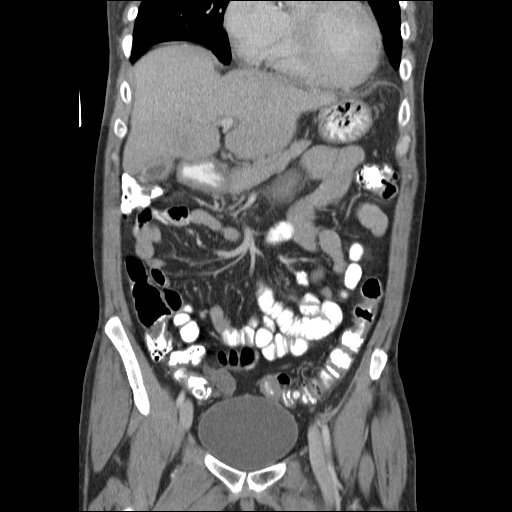
[im 31/69  soft-tissue]
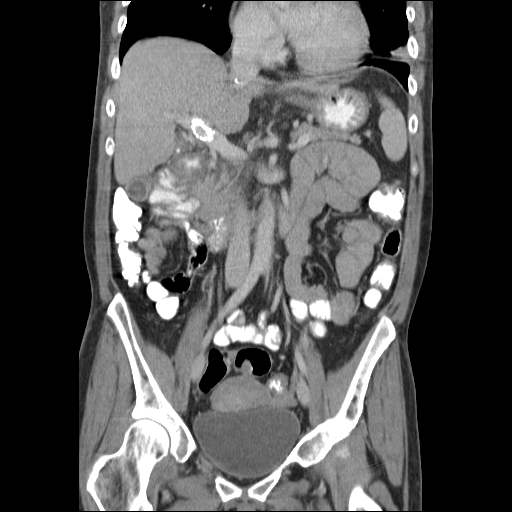
[im 38/69  soft-tissue]
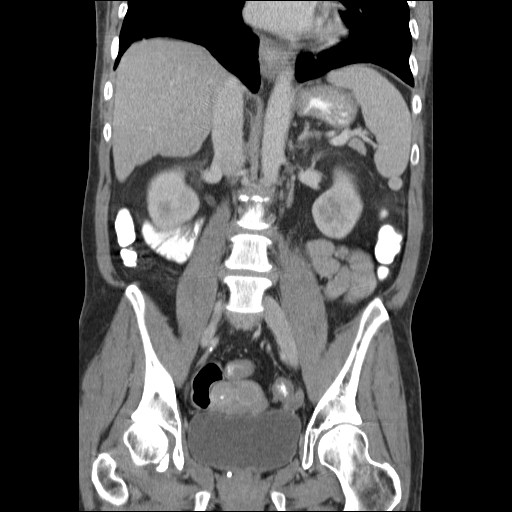

[16 of 46 positions shown; findings below may reference images not displayed]

FINDINGS: Bibasilar atelectasis is noted.

The liver and spleen are unremarkable in appearance.  A TIPS is
noted in expected position, and appears patent.  Two small stones
are seen layering dependently within the gallbladder; the
gallbladder is otherwise unremarkable in appearance.  The pancreas
and adrenal glands are within normal limits.

The kidneys are unremarkable in appearance.  No hydronephrosis is
appreciated.  No renal or ureteral stones are identified.  No
perinephric stranding is seen.

There is significant focal wall thickening along the antrum of the
stomach, with question of associated ulcerations.  Suggest
correlation with previously described gastric ulcers.

The small bowel is unremarkable in appearance.  Contrast progresses
to the level of the rectum.  No free fluid is seen within the
abdomen or pelvis.  No acute vascular abnormalities are seen.

There is apparent diffuse mild wall thickening involving the
proximal sigmoid colon, with associated scattered diverticula along
the sigmoid colon.  Given the segmental appearance, this may
reflect a mild sigmoid colitis.  There is no evidence of
perforation or abscess formation.  No significant surrounding soft
tissue stranding is appreciated.

The colon is filled with contrast.  The patient is status post
appendectomy.

The bladder is significantly distended and is unremarkable
appearance.  Multiple fibroids are identified within the uterus,
with scattered calcification.  The ovaries are unremarkable in
appearance; no suspicious adnexal masses are seen.  No inguinal
lymphadenopathy is appreciated.

No acute osseous abnormalities are seen.
IMPRESSION: 1. Apparent diffuse mild wall thickening involving the proximal
sigmoid colon, suggestive of mild sigmoid colitis.  No evidence of
perforation or abscess formation.
2.  Focal wall thickening along the antrum of the stomach, with
question of associated ulcerations.  Suggest correlation with
previously described gastric ulcers.
3.  Diverticulosis along the sigmoid colon, without definite
diverticulitis.
4.  Cholelithiasis.
5.  Bibasilar atelectasis.
6.  TIPS remains patent.
7.  Fibroid uterus.

## 2012-07-08 ENCOUNTER — Emergency Department (HOSPITAL_BASED_OUTPATIENT_CLINIC_OR_DEPARTMENT_OTHER)
Admission: EM | Admit: 2012-07-08 | Discharge: 2012-07-08 | Disposition: A | Discharge status: Home / Self Care | Payer: Medicare Other | Attending: Emergency Medicine | Admitting: Emergency Medicine

## 2012-07-08 ENCOUNTER — Encounter (HOSPITAL_BASED_OUTPATIENT_CLINIC_OR_DEPARTMENT_OTHER): Payer: Self-pay

## 2012-07-08 DIAGNOSIS — M069 Rheumatoid arthritis, unspecified: Secondary | ICD-10-CM | POA: Insufficient documentation

## 2012-07-08 DIAGNOSIS — M62830 Muscle spasm of back: Secondary | ICD-10-CM

## 2012-07-08 DIAGNOSIS — M5380 Other specified dorsopathies, site unspecified: Secondary | ICD-10-CM | POA: Insufficient documentation

## 2012-07-08 HISTORY — DX: Peptic ulcer, site unspecified, unspecified as acute or chronic, without hemorrhage or perforation: K27.9

## 2012-07-08 HISTORY — DX: Rheumatoid arthritis, unspecified: M06.9

## 2012-07-08 MED ORDER — DIAZEPAM 5 MG PO TABS
5.0000 mg | ORAL_TABLET | Freq: Two times a day (BID) | ORAL | Status: DC | PRN
Start: 2012-07-08 — End: 2012-07-18

## 2012-07-08 MED ORDER — OXYCODONE-ACETAMINOPHEN 5-325 MG PO TABS
2.0000 | ORAL_TABLET | Freq: Once | ORAL | Status: AC
  Administered 2012-07-08: 2 via ORAL
  Filled 2012-07-08: qty 2

## 2012-07-08 MED ORDER — OXYCODONE-ACETAMINOPHEN 5-325 MG PO TABS: ORAL_TABLET | ORAL | Status: DC

## 2012-07-08 NOTE — ED Provider Notes (Signed)
History  This chart was scribed for [name], MD by Bennett Scrape. This patient was seen in room MH04/MH04 and the patient's care was started at 5:28PM.  CSN: [id number]  Arrival date & time 07/08/12  1706   First MD Initiated Contact with Patient 07/08/12 1728      Chief Complaint  Patient presents with  . Back Pain    The history is provided by the patient. No language interpreter was used.   Andrea Rodriguez is a 56 y.o. female who presents to the Emergency Department complaining of 4 days of sudden onset, non-changing, constant lower back pain that radiates into her hips and down her posterior bilateral thigh that woke her up out of sleep. She denies any recent falls or injury as the cause. Pt reports that bending over improves symptoms and allows walking with less pain. She denies taking OTC medications to improve her symptoms due to her h/o ulcers. She denies emesis, nausea, and abdominal pain as associated symptoms.  She denies prior episodes of similar symptoms and denies having a h/o back pain. She is an occasional alcohol user but denies smoking. Prior records indicate she has a sig h/o drinking alcohol.  Past Medical History  Diagnosis Date  . Rheumatoid arthritis   . PUD (peptic ulcer disease)     Past Surgical History  Procedure Date  . Joint replacement     History reviewed. No pertinent family history.  History  Substance Use Topics  . Smoking status: Never Smoker   . Smokeless tobacco: Never Used  . Alcohol Use: Yes     occasional    No OB history provided.  Review of Systems  Constitutional: Negative for fever and chills.  Gastrointestinal: Negative for nausea, vomiting and abdominal pain.  Musculoskeletal: Positive for back pain.       Bilateral hip and thigh pain  Neurological: Negative for dizziness, weakness and numbness.    Allergies  Ibuprofen and Toradol  Home Medications   Current Outpatient Rx  Name Route Sig Dispense Refill  .  HYDROCODONE-ACETAMINOPHEN 5-500 MG PO TABS Oral Take 1 tablet by mouth every 6 (six) hours as needed. Patient used this medication for pain.    Marland Kitchen DIAZEPAM 5 MG PO TABS Oral Take 1 tablet (5 mg total) by mouth every 12 (twelve) hours as needed (muscle spasm). 10 tablet 0  . OXYCODONE-ACETAMINOPHEN 5-325 MG PO TABS  1-2 tablets po q 6 hours prn moderate to severe pain 20 tablet 0    Triage vitals: BP 117/74  Pulse 107  Temp 98.9 F (37.2 C) (Oral)  Resp 16  Ht 5\' 7"  (1.702 m)  Wt 128 lb (58.06 kg)  BMI 20.05 kg/m2  SpO2 100%  Physical Exam  Nursing note and vitals reviewed. Constitutional: She is oriented to person, place, and time. She appears well-developed and well-nourished. No distress.  HENT:  Head: Normocephalic and atraumatic.  Eyes: Conjunctivae and EOM are normal. Pupils are equal, round, and reactive to light.  Neck: Normal range of motion. No tracheal deviation present.  Cardiovascular: Tachycardia present.   Pulmonary/Chest: Effort normal and breath sounds normal. No respiratory distress.  Musculoskeletal: Normal range of motion.       Lumbar back: She exhibits tenderness, pain and spasm. She exhibits no bony tenderness and normal pulse.  Neurological: She is alert and oriented to person, place, and time.  Reflex Scores:      Patellar reflexes are 2+ on the right side.  Achilles reflexes are 1+ on the right side and 1+ on the left side.      Due to prior knee replacement surgery patellar reflex on left was abnormal.  Distal strength of both legs was normal.  Skin: Skin is warm and dry.  Psychiatric: She has a normal mood and affect. Her behavior is normal.    ED Course  Procedures (including critical care time)  DIAGNOSTIC STUDIES: Oxygen Saturation is 100% on room air, normal by my interpretation.    COORDINATION OF CARE: 5:40- Discussed prescribing pain medication and muscle relaxants with pt and pt agreed.      Labs Reviewed - No data to display No  results found.   1. Lumbar paraspinal muscle spasm       MDM  I personally performed the services described in this documentation, which was scribed in my presence. The recorded information has been reviewed and considered.  No cauda equina.  No weakness.  No urinary or bowel difficulty.  Spasms of low back on exam.  No trauma, not febrile here.  Given analgesics and Rx for muscle relaxants.  Pt encouraged PCP follow up        Gavin Pound. Oletta Lamas, MD 07/11/12 2238

## 2012-07-08 NOTE — ED Notes (Signed)
Low back pain radiating to bilateral hips and legs.

## 2012-07-08 NOTE — Discharge Instructions (Signed)
Narcotic and benzodiazepine use may cause drowsiness, slowed breathing or dependence.  Please use with caution and do not drive, operate machinery or watch young children alone while taking them.  Taking combinations of these medications or drinking alcohol will potentiate these effects.    

## 2012-07-11 ENCOUNTER — Encounter (HOSPITAL_BASED_OUTPATIENT_CLINIC_OR_DEPARTMENT_OTHER): Payer: Self-pay | Admitting: Emergency Medicine

## 2012-07-12 ENCOUNTER — Emergency Department (HOSPITAL_BASED_OUTPATIENT_CLINIC_OR_DEPARTMENT_OTHER)
Admission: EM | Admit: 2012-07-12 | Discharge: 2012-07-12 | Disposition: A | Discharge status: Home / Self Care | Payer: Medicare Other | Attending: Emergency Medicine | Admitting: Emergency Medicine

## 2012-07-12 ENCOUNTER — Encounter (HOSPITAL_BASED_OUTPATIENT_CLINIC_OR_DEPARTMENT_OTHER): Payer: Self-pay | Admitting: *Deleted

## 2012-07-12 DIAGNOSIS — M545 Low back pain, unspecified: Secondary | ICD-10-CM | POA: Insufficient documentation

## 2012-07-12 DIAGNOSIS — M069 Rheumatoid arthritis, unspecified: Secondary | ICD-10-CM | POA: Insufficient documentation

## 2012-07-12 DIAGNOSIS — M549 Dorsalgia, unspecified: Secondary | ICD-10-CM

## 2012-07-12 MED ORDER — PREDNISONE 10 MG PO TABS: 20.0000 mg | ORAL_TABLET | Freq: Every day | ORAL | Status: DC

## 2012-07-12 MED ORDER — CYCLOBENZAPRINE HCL 10 MG PO TABS: 10.0000 mg | ORAL_TABLET | Freq: Two times a day (BID) | ORAL | Status: AC | PRN

## 2012-07-12 NOTE — ED Notes (Signed)
Lower back pain with spasms into the back of both legs. States she was seen for same a week ago.

## 2012-07-12 NOTE — ED Provider Notes (Signed)
History     CSN: [id number]  Arrival date & time 07/12/12  2118   First MD Initiated Contact with Patient 07/12/12 2152      Chief Complaint  Patient presents with  . Back Pain    (Consider location/radiation/quality/duration/timing/severity/associated sxs/prior treatment) HPI Patient complaining of low back pain radiating to bilateral posterior thighs for 9 days.  No injury.  Pain is sharp and feels like electric shocks.  Patient seen here a week ago and states pain continues and is worsening.  Patient states she hasn't been in town long enough to have pmd. Denies numbness, tingling, weakness, or loss of bowel or bladder control.   Patient taking percocet and valium with some relief.   Past Medical History  Diagnosis Date  . Rheumatoid arthritis   . PUD (peptic ulcer disease)     Past Surgical History  Procedure Date  . Joint replacement     No family history on file.  History  Substance Use Topics  . Smoking status: Never Smoker   . Smokeless tobacco: Never Used  . Alcohol Use: Yes     occasional    OB History    Grav Para Term Preterm Abortions TAB SAB Ect Mult Living                  Review of Systems  Unable to perform ROS   Allergies  Erythromycin; Ibuprofen; Nsaids; Toradol; and Tramadol  Home Medications   Current Outpatient Rx  Name Route Sig Dispense Refill  . VITAMIN C 1000 MG PO TABS Oral Take 1,000 mg by mouth daily.    Marland Kitchen HYDROCODONE-ACETAMINOPHEN 5-500 MG PO TABS Oral Take 1 tablet by mouth every 6 (six) hours as needed. Patient used this medication for pain.    Andrea Rodriguez WOMENS PO Oral Take 1 tablet by mouth daily.    Marland Kitchen FISH OIL PO Oral Take 1 capsule by mouth daily.    . OXYCODONE-ACETAMINOPHEN 5-325 MG PO TABS Oral Take 1 tablet by mouth every 4 (four) hours as needed. 1-2 tablets po q 6 hours prn moderate to severe pain    . VITAMIN D (ERGOCALCIFEROL) 50000 UNITS PO CAPS Oral Take 50,000 Units by mouth every 7 (seven) days. Patient takes  this medication on Monday's.    Marland Kitchen DIAZEPAM 5 MG PO TABS Oral Take 5 mg by mouth every 12 (twelve) hours as needed. For spasms.      BP 118/61  Pulse 107  Temp 98.1 F (36.7 C) (Oral)  Resp 20  SpO2 97%  Physical Exam  Nursing note and vitals reviewed. Constitutional: She is oriented to person, place, and time. She appears well-developed and well-nourished.  HENT:  Head: Normocephalic and atraumatic.  Eyes: Conjunctivae are normal. Pupils are equal, round, and reactive to light.  Neck: Normal range of motion. Neck supple.  Cardiovascular: Normal rate and regular rhythm.   Pulmonary/Chest: Effort normal and breath sounds normal.  Abdominal: Soft. Bowel sounds are normal.  Musculoskeletal: Normal range of motion.  Neurological: She is alert and oriented to person, place, and time. She has normal strength and normal reflexes. A sensory deficit is present. She displays a negative Romberg sign.       Strength bilateral leg extensors 5/5, knee extensor j5/5, foot dorsal and plantar flexion equal. Normal perineal sensation.  Great toe plantar and dorsiflexion equal.  Leg and lower leg flexors equal .     ED Course  Procedures (including critical care time)  Labs Reviewed -  No data to display No results found.   No diagnosis found.    MDM         Hilario Quarry, MD 07/12/12 2205

## 2015-02-05 ENCOUNTER — Encounter (HOSPITAL_COMMUNITY): Payer: Self-pay | Admitting: General Practice

## 2015-02-05 ENCOUNTER — Emergency Department (HOSPITAL_COMMUNITY): Payer: Medicare Other

## 2015-02-05 ENCOUNTER — Emergency Department (HOSPITAL_COMMUNITY)
Admission: EM | Admit: 2015-02-05 | Discharge: 2015-02-05 | Disposition: A | Discharge status: Home / Self Care | Payer: Medicare Other | Attending: Emergency Medicine | Admitting: Emergency Medicine

## 2015-02-05 DIAGNOSIS — Z7952 Long term (current) use of systemic steroids: Secondary | ICD-10-CM | POA: Insufficient documentation

## 2015-02-05 DIAGNOSIS — Z720 Tobacco use: Secondary | ICD-10-CM | POA: Diagnosis not present

## 2015-02-05 DIAGNOSIS — Y9389 Activity, other specified: Secondary | ICD-10-CM | POA: Diagnosis not present

## 2015-02-05 DIAGNOSIS — R000 Tachycardia, unspecified: Secondary | ICD-10-CM | POA: Diagnosis not present

## 2015-02-05 DIAGNOSIS — Z79899 Other long term (current) drug therapy: Secondary | ICD-10-CM | POA: Diagnosis not present

## 2015-02-05 DIAGNOSIS — Y998 Other external cause status: Secondary | ICD-10-CM | POA: Diagnosis not present

## 2015-02-05 DIAGNOSIS — Y92410 Unspecified street and highway as the place of occurrence of the external cause: Secondary | ICD-10-CM | POA: Diagnosis not present

## 2015-02-05 DIAGNOSIS — N390 Urinary tract infection, site not specified: Secondary | ICD-10-CM | POA: Diagnosis not present

## 2015-02-05 DIAGNOSIS — Z8719 Personal history of other diseases of the digestive system: Secondary | ICD-10-CM | POA: Diagnosis not present

## 2015-02-05 DIAGNOSIS — Z041 Encounter for examination and observation following transport accident: Secondary | ICD-10-CM | POA: Insufficient documentation

## 2015-02-05 DIAGNOSIS — M069 Rheumatoid arthritis, unspecified: Secondary | ICD-10-CM | POA: Diagnosis not present

## 2015-02-05 LAB — CBC
HEMATOCRIT: 34.3 % — AB (ref 36.0–46.0)
HEMOGLOBIN: 10.2 g/dL — AB (ref 12.0–15.0)
MCH: 23.6 pg — ABNORMAL LOW (ref 26.0–34.0)
MCHC: 29.7 g/dL — ABNORMAL LOW (ref 30.0–36.0)
MCV: 79.2 fL (ref 78.0–100.0)
Platelets: 140 10*3/uL — ABNORMAL LOW (ref 150–400)
RBC: 4.33 MIL/uL (ref 3.87–5.11)
RDW: 18 % — ABNORMAL HIGH (ref 11.5–15.5)
WBC: 8.4 10*3/uL (ref 4.0–10.5)

## 2015-02-05 LAB — COMPREHENSIVE METABOLIC PANEL
ALBUMIN: 3.3 g/dL — AB (ref 3.5–5.2)
ALT: 18 U/L (ref 0–35)
AST: 29 U/L (ref 0–37)
Alkaline Phosphatase: 106 U/L (ref 39–117)
Anion gap: 12 (ref 5–15)
BUN: 17 mg/dL (ref 6–23)
CALCIUM: 8.6 mg/dL (ref 8.4–10.5)
CO2: 17 mmol/L — ABNORMAL LOW (ref 19–32)
Chloride: 104 mmol/L (ref 96–112)
Creatinine, Ser: 0.75 mg/dL (ref 0.50–1.10)
GFR calc Af Amer: >90 mL/min (ref >90)
GFR calc non Af Amer: >90 mL/min (ref >90)
Glucose, Bld: 122 mg/dL — ABNORMAL HIGH (ref 70–99)
Potassium: 3.8 mmol/L (ref 3.5–5.1)
SODIUM: 133 mmol/L — AB (ref 135–145)
TOTAL PROTEIN: 6.6 g/dL (ref 6.0–8.3)
Total Bilirubin: 0.7 mg/dL (ref 0.3–1.2)

## 2015-02-05 LAB — URINALYSIS, ROUTINE W REFLEX MICROSCOPIC
Bilirubin Urine: NEGATIVE
Glucose, UA: NEGATIVE mg/dL
Hgb urine dipstick: NEGATIVE
Ketones, ur: NEGATIVE mg/dL
NITRITE: POSITIVE — AB
PH: 5.5 (ref 5.0–8.0)
Protein, ur: NEGATIVE mg/dL
SPECIFIC GRAVITY, URINE: 1.014 (ref 1.005–1.030)
UROBILINOGEN UA: 0.2 mg/dL (ref 0.0–1.0)

## 2015-02-05 LAB — DIFFERENTIAL
BASOS PCT: 0 % (ref 0–1)
Basophils Absolute: 0 10*3/uL (ref 0.0–0.1)
EOS ABS: 0.3 10*3/uL (ref 0.0–0.7)
EOS PCT: 3 % (ref 0–5)
LYMPHS PCT: 18 % (ref 12–46)
Lymphs Abs: 1.5 10*3/uL (ref 0.7–4.0)
MONO ABS: 1.1 10*3/uL — AB (ref 0.1–1.0)
Monocytes Relative: 13 % — ABNORMAL HIGH (ref 3–12)
Neutro Abs: 5.5 10*3/uL (ref 1.7–7.7)
Neutrophils Relative %: 66 % (ref 43–77)

## 2015-02-05 LAB — I-STAT CHEM 8, ED
BUN: 17 mg/dL (ref 6–23)
BUN: 23 mg/dL (ref 6–23)
CHLORIDE: 105 mmol/L (ref 96–112)
CHLORIDE: 102 mmol/L (ref 96–112)
Calcium, Ion: 1.08 mmol/L — ABNORMAL LOW (ref 1.12–1.23)
Calcium, Ion: 1.23 mmol/L (ref 1.12–1.23)
Creatinine, Ser: 0.7 mg/dL (ref 0.50–1.10)
Creatinine, Ser: 0.9 mg/dL (ref 0.50–1.10)
GLUCOSE: 112 mg/dL — AB (ref 70–99)
Glucose, Bld: 110 mg/dL — ABNORMAL HIGH (ref 70–99)
HCT: 37 % (ref 36.0–46.0)
HEMATOCRIT: 39 % (ref 36.0–46.0)
Hemoglobin: 12.6 g/dL (ref 12.0–15.0)
Hemoglobin: 13.3 g/dL (ref 12.0–15.0)
Potassium: 3.9 mmol/L (ref 3.5–5.1)
Potassium: 4.2 mmol/L (ref 3.5–5.1)
SODIUM: 139 mmol/L (ref 135–145)
SODIUM: 134 mmol/L — AB (ref 135–145)
TCO2: 19 mmol/L (ref 0–100)
TCO2: 16 mmol/L (ref 0–100)

## 2015-02-05 LAB — RAPID URINE DRUG SCREEN, HOSP PERFORMED
AMPHETAMINES: NOT DETECTED
Barbiturates: NOT DETECTED
Benzodiazepines: NOT DETECTED
COCAINE: NOT DETECTED
OPIATES: POSITIVE — AB
Tetrahydrocannabinol: NOT DETECTED

## 2015-02-05 LAB — URINE MICROSCOPIC-ADD ON

## 2015-02-05 LAB — APTT: aPTT: 35 seconds (ref 24–37)

## 2015-02-05 LAB — ETHANOL: Alcohol, Ethyl (B): 93 mg/dL — ABNORMAL HIGH (ref 0–9)

## 2015-02-05 LAB — PROTIME-INR
INR: 1.21 (ref 0.00–1.49)
Prothrombin Time: 15.5 seconds — ABNORMAL HIGH (ref 11.6–15.2)

## 2015-02-05 MED ORDER — LORAZEPAM 1 MG PO TABS
1.0000 mg | ORAL_TABLET | Freq: Once | ORAL | Status: AC
  Administered 2015-02-05: 1 mg via ORAL
  Filled 2015-02-05: qty 1

## 2015-02-05 MED ORDER — CEPHALEXIN 500 MG PO CAPS: 500.0000 mg | ORAL_CAPSULE | Freq: Four times a day (QID) | ORAL | Status: DC

## 2015-02-05 MED ORDER — FENTANYL CITRATE 0.05 MG/ML IJ SOLN
100.0000 ug | Freq: Once | INTRAMUSCULAR | Status: AC
  Administered 2015-02-05: 100 ug via INTRAMUSCULAR
  Filled 2015-02-05: qty 2
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
## 2015-07-03 ENCOUNTER — Emergency Department (HOSPITAL_COMMUNITY)
Admission: EM | Admit: 2015-07-03 | Discharge: 2015-07-03 | Disposition: A | Discharge status: Home / Self Care | Payer: Medicare Other | Attending: Emergency Medicine | Admitting: Emergency Medicine

## 2015-07-03 ENCOUNTER — Encounter (HOSPITAL_COMMUNITY): Payer: Self-pay | Admitting: Emergency Medicine

## 2015-07-03 ENCOUNTER — Emergency Department (HOSPITAL_COMMUNITY): Payer: Medicare Other

## 2015-07-03 DIAGNOSIS — K279 Peptic ulcer, site unspecified, unspecified as acute or chronic, without hemorrhage or perforation: Secondary | ICD-10-CM | POA: Diagnosis not present

## 2015-07-03 DIAGNOSIS — Z8701 Personal history of pneumonia (recurrent): Secondary | ICD-10-CM | POA: Insufficient documentation

## 2015-07-03 DIAGNOSIS — R1110 Vomiting, unspecified: Secondary | ICD-10-CM | POA: Diagnosis not present

## 2015-07-03 DIAGNOSIS — Z79891 Long term (current) use of opiate analgesic: Secondary | ICD-10-CM | POA: Diagnosis not present

## 2015-07-03 DIAGNOSIS — Z8739 Personal history of other diseases of the musculoskeletal system and connective tissue: Secondary | ICD-10-CM | POA: Insufficient documentation

## 2015-07-03 DIAGNOSIS — Z79899 Other long term (current) drug therapy: Secondary | ICD-10-CM | POA: Insufficient documentation

## 2015-07-03 DIAGNOSIS — Z87448 Personal history of other diseases of urinary system: Secondary | ICD-10-CM | POA: Insufficient documentation

## 2015-07-03 DIAGNOSIS — Z720 Tobacco use: Secondary | ICD-10-CM | POA: Insufficient documentation

## 2015-07-03 DIAGNOSIS — R1013 Epigastric pain: Secondary | ICD-10-CM | POA: Diagnosis not present

## 2015-07-03 DIAGNOSIS — Z8619 Personal history of other infectious and parasitic diseases: Secondary | ICD-10-CM | POA: Insufficient documentation

## 2015-07-03 HISTORY — DX: Lyme disease, unspecified: A69.20

## 2015-07-03 HISTORY — DX: Unspecified viral hepatitis C without hepatic coma: B19.20

## 2015-07-03 HISTORY — DX: Disorder of kidney and ureter, unspecified: N28.9

## 2015-07-03 LAB — COMPREHENSIVE METABOLIC PANEL
ALK PHOS: 117 U/L (ref 38–126)
ALT: 16 U/L (ref 14–54)
ANION GAP: 10 (ref 5–15)
AST: 39 U/L (ref 15–41)
Albumin: 3.4 g/dL — ABNORMAL LOW (ref 3.5–5.0)
BUN: 14 mg/dL (ref 6–20)
CALCIUM: 8.9 mg/dL (ref 8.9–10.3)
CO2: 21 mmol/L — AB (ref 22–32)
Chloride: 99 mmol/L — ABNORMAL LOW (ref 101–111)
Creatinine, Ser: 0.95 mg/dL (ref 0.44–1.00)
GLUCOSE: 92 mg/dL (ref 65–99)
Potassium: 3 mmol/L — ABNORMAL LOW (ref 3.5–5.1)
SODIUM: 130 mmol/L — AB (ref 135–145)
Total Bilirubin: 1 mg/dL (ref 0.3–1.2)
Total Protein: 7.6 g/dL (ref 6.5–8.1)

## 2015-07-03 LAB — CBC WITH DIFFERENTIAL/PLATELET
Basophils Absolute: 0.1 10*3/uL (ref 0.0–0.1)
Basophils Relative: 1 % (ref 0–1)
Eosinophils Absolute: 0.5 10*3/uL (ref 0.0–0.7)
Eosinophils Relative: 5 % (ref 0–5)
HEMATOCRIT: 33.1 % — AB (ref 36.0–46.0)
Hemoglobin: 10.3 g/dL — ABNORMAL LOW (ref 12.0–15.0)
LYMPHS PCT: 29 % (ref 12–46)
Lymphs Abs: 2.6 10*3/uL (ref 0.7–4.0)
MCH: 24.8 pg — ABNORMAL LOW (ref 26.0–34.0)
MCHC: 31.1 g/dL (ref 30.0–36.0)
MCV: 79.8 fL (ref 78.0–100.0)
MONO ABS: 1.2 10*3/uL — AB (ref 0.1–1.0)
MONOS PCT: 14 % — AB (ref 3–12)
Neutro Abs: 4.6 10*3/uL (ref 1.7–7.7)
Neutrophils Relative %: 51 % (ref 43–77)
Platelets: 203 10*3/uL (ref 150–400)
RBC: 4.15 MIL/uL (ref 3.87–5.11)
RDW: 17.5 % — ABNORMAL HIGH (ref 11.5–15.5)
WBC: 9 10*3/uL (ref 4.0–10.5)

## 2015-07-03 LAB — AMMONIA: AMMONIA: 49 umol/L — AB (ref 9–35)

## 2015-07-03 LAB — I-STAT TROPONIN, ED: TROPONIN I, POC: 0.05 ng/mL (ref 0.00–0.08)

## 2015-07-03 LAB — LIPASE, BLOOD: Lipase: 32 U/L (ref 22–51)

## 2015-07-03 MED ORDER — SODIUM CHLORIDE 0.9 % IV BOLUS (SEPSIS)
1000.0000 mL | Freq: Once | INTRAVENOUS | Status: AC
  Administered 2015-07-03: 1000 mL via INTRAVENOUS

## 2015-07-03 MED ORDER — POTASSIUM CHLORIDE CRYS ER 20 MEQ PO TBCR
60.0000 meq | EXTENDED_RELEASE_TABLET | Freq: Once | ORAL | Status: AC
  Administered 2015-07-03: 60 meq via ORAL
  Filled 2015-07-03: qty 3

## 2015-07-03 MED ORDER — HYDROMORPHONE HCL 1 MG/ML IJ SOLN
1.0000 mg | Freq: Once | INTRAMUSCULAR | Status: AC
  Administered 2015-07-03: 1 mg via INTRAVENOUS
  Filled 2015-07-03: qty 1

## 2015-07-03 MED ORDER — ALUM & MAG HYDROXIDE-SIMETH 200-200-20 MG/5ML PO SUSP
30.0000 mL | Freq: Once | ORAL | Status: AC
  Administered 2015-07-03: 30 mL via ORAL
  Filled 2015-07-03: qty 30

## 2015-07-03 MED ORDER — MAGNESIUM SULFATE 2 GM/50ML IV SOLN
2.0000 g | Freq: Once | INTRAVENOUS | Status: AC
  Administered 2015-07-03: 2 g via INTRAVENOUS
  Filled 2015-07-03: qty 50

## 2015-07-03 MED ORDER — ALBUTEROL SULFATE (2.5 MG/3ML) 0.083% IN NEBU: 2.5000 mg | INHALATION_SOLUTION | Freq: Four times a day (QID) | RESPIRATORY_TRACT | Status: AC | PRN

## 2015-07-03 MED ORDER — IOHEXOL 300 MG/ML  SOLN
100.0000 mL | Freq: Once | INTRAMUSCULAR | Status: AC | PRN
  Administered 2015-07-03: 100 mL via INTRAVENOUS

## 2015-07-03 MED ORDER — RANITIDINE HCL 150 MG/10ML PO SYRP
300.0000 mg | ORAL_SOLUTION | Freq: Once | ORAL | Status: AC
  Administered 2015-07-03: 300 mg via ORAL
  Filled 2015-07-03: qty 20

## 2015-07-03 MED ORDER — IOHEXOL 300 MG/ML  SOLN
25.0000 mL | Freq: Once | INTRAMUSCULAR | Status: AC | PRN
  Administered 2015-07-03: 25 mL via ORAL

## 2015-07-03 MED ORDER — ONDANSETRON HCL 4 MG/2ML IJ SOLN
4.0000 mg | Freq: Once | INTRAMUSCULAR | Status: AC
  Administered 2015-07-03: 4 mg via INTRAVENOUS
  Filled 2015-07-03: qty 2

## 2015-07-03 NOTE — ED Provider Notes (Signed)
CSN: [id number]     Arrival date & time 07/03/15  4010 History   First MD Initiated Contact with Patient 07/03/15 [phone]     Chief Complaint  Patient presents with  . Abdominal Pain    The patient said she has been having abdominal pain for four days.  She also says she has been having N/V for four days.  She has thrown up three times today.     (Consider location/radiation/quality/duration/timing/severity/associated sxs/prior Treatment) HPI  Andrea Rodriguez is a 59 y.o. female with past HISTORY of peptic ulcer disease status post laparotomy for perforation, hepatitis C cirrhosis, presenting today with abdominal pain. Patient states her pain is midepigastric and burning. It began on Tuesday and has worsened throughout the week. She has oxycodone 15 mg at home that she takes for pain. She has had vomiting with no diarrhea. She denies urinary changes. Patient states this is consistent with her pain from her last perforated ulcer. Son in the room also notes some intermittent confusion is concerned that her ammonia is too high. Patient states she has been compliant with her lactulose medications. She denies any fevers. She was recently treated for pneumonia and completed the interbody course. She has no further complaints.  10 Systems reviewed and are negative for acute change except as noted in the HPI.     Past Medical History  Diagnosis Date  . Rheumatoid arthritis(714.0)   . PUD (peptic ulcer disease)   . Hepatitis C   . Lyme disease   . Renal disorder    Past Surgical History  Procedure Laterality Date  . Joint replacement     History reviewed. No pertinent family history. History  Substance Use Topics  . Smoking status: Current Every Day Smoker    Types: Cigarettes  . Smokeless tobacco: Never Used  . Alcohol Use: 1.2 oz/week    2 Standard drinks or equivalent per week     Comment: occasional   OB History    No data available     Review of Systems    Allergies   Erythromycin; Ibuprofen; Nsaids; Toradol; Tramadol; and Tylenol  Home Medications   Prior to Admission medications   Medication Sig Start Date End Date Taking? Authorizing Provider  cholecalciferol (VITAMIN D) 1000 UNITS tablet Take 1,000 Units by mouth daily.   Yes Historical Provider, MD  lactulose (CHRONULAC) 10 GM/15ML solution Take 10 g by mouth 3 (three) times daily.   Yes Historical Provider, MD  methimazole (TAPAZOLE) 5 MG tablet Take 5 mg by mouth 3 (three) times daily.   Yes Historical Provider, MD  Multiple Vitamin (MULTIVITAMIN WITH MINERALS) TABS tablet Take 1 tablet by mouth daily.   Yes Historical Provider, MD  oxyCODONE (ROXICODONE) 15 MG immediate release tablet Take 15 mg by mouth 5 (five) times daily.  01/12/15  Yes Historical Provider, MD  pantoprazole (PROTONIX) 40 MG tablet Take 40 mg by mouth 2 (two) times daily.   Yes Historical Provider, MD   BP 124/81 mmHg  Pulse 102  Temp(Src) 98.2 F (36.8 C) (Oral)  Resp 16  Ht 5\' 6"  (1.676 m)  Wt 123 lb (55.792 kg)  BMI 19.86 kg/m2  SpO2 100% Physical Exam  Constitutional: She is oriented to person, place, and time. She appears well-developed and well-nourished. No distress.  HENT:  Head: Normocephalic and atraumatic.  Nose: Nose normal.  Mouth/Throat: Oropharynx is clear and moist. No oropharyngeal exudate.  Eyes: Conjunctivae and EOM are normal. Pupils are equal, round, and reactive  to light. No scleral icterus.  Neck: Normal range of motion. Neck supple. No JVD present. No tracheal deviation present. No thyromegaly present.  Cardiovascular: Normal rate, regular rhythm and normal heart sounds.  Exam reveals no gallop and no friction rub.   No murmur heard. Pulmonary/Chest: Effort normal and breath sounds normal. No respiratory distress. She has no wheezes. She exhibits no tenderness.  Abdominal: Soft. Bowel sounds are normal. She exhibits no distension and no mass. There is no tenderness. There is no rebound and no  guarding.  Midline abdominal scar, well-healed.  Musculoskeletal: Normal range of motion. She exhibits no edema or tenderness.  Lymphadenopathy:    She has no cervical adenopathy.  Neurological: She is alert and oriented to person, place, and time. No cranial nerve deficit. She exhibits normal muscle tone.  Skin: Skin is warm and dry. No rash noted. She is not diaphoretic. No erythema. No pallor.  Nursing note and vitals reviewed.   ED Course  Procedures (including critical care time) Labs Review Labs Reviewed  CBC WITH DIFFERENTIAL/PLATELET - Abnormal; Notable for the following:    Hemoglobin 10.3 (*)    HCT 33.1 (*)    MCH 24.8 (*)    RDW 17.5 (*)    Monocytes Relative 14 (*)    Monocytes Absolute 1.2 (*)    All other components within normal limits  COMPREHENSIVE METABOLIC PANEL - Abnormal; Notable for the following:    Sodium 130 (*)    Potassium 3.0 (*)    Chloride 99 (*)    CO2 21 (*)    Albumin 3.4 (*)    All other components within normal limits  AMMONIA - Abnormal; Notable for the following:    Ammonia 49 (*)    All other components within normal limits  LIPASE, BLOOD  I-STAT TROPOININ, ED    Imaging Review No results found.   EKG Interpretation None      MDM   Final diagnoses:  Epigastric abdominal pain   patient presents emergency department for abdominal pain. She is concerned her peptic ulcer disease has ruptured. She will be given IV fluids, Dilaudid, and we will obtain a CT scan to evaluate. Patient was also given Zofran, ranitidine for symptomatic control. CT scan will be signed out to Dr. Loretha Stapler.  Please see his note for the ultimate disposition of the patient.  If CT scan is normal and her pain is under control, I believe the patient is safe for DC with close fu.  Tomasita Crumble, MD 07/03/15 [phone]

## 2015-07-03 NOTE — ED Provider Notes (Signed)
Care assumed from Dr. Mora Bellman.  59 yo female with very complicated course after ruptured peptic ulcer a few months ago.  She is here with epigastric abdominal pain.  On my re-evalution, she had persistent epigastric and LUQ pain.  CT showed prominent bile duct (LFTs were normal) and distal esophageal thickening (which is likely due to her persistent esophagitis.)   After a prolonged conversation with pt and her son, it became clear that part of their reason for presenting to the emergency department was due to their growing frustration with managing her new debilitating medical condition.  To that end, I offered a social work evaluation.  Thy initially agreed to this, but then decided they preferred to be discharged.   I also offered RUQ US imaging, which they also declined.  She did not have elevated LFTs and her abdominal pain and tenderness with epigastric and LUQ (not RUQ).  Therefore, I think it's ok to defer this test.   Advised that she follow up closely with her PCP and specialists.  Clinical Impression: 1. Epigastric abdominal pain       Blake Divine, MD 07/03/15 [phone]

## 2015-07-03 NOTE — ED Notes (Signed)
Reported pain to Dr. Mora Bellman, patient last received dose at 0604. MD acknoweldges, plans to order pain medication when patient returns from CT .explained to patient, she acknowledges. Preparing for transfer to ct.

## 2015-07-03 NOTE — ED Notes (Signed)
Pt requesting to leave due to family issue. Dr Loretha Stapler aware.

## 2015-07-03 NOTE — ED Notes (Signed)
The patient said she has been having abdominal pain for four days.  She also says she has been having N/V for four days.  She has thrown up three times today.  The patient says she has been sick for a while and was seen at [facility] for a perforated ulcer.  She rates her pain 10/10.

## 2015-12-10 ENCOUNTER — Encounter (HOSPITAL_COMMUNITY): Payer: Self-pay | Admitting: Emergency Medicine

## 2015-12-10 ENCOUNTER — Emergency Department (HOSPITAL_COMMUNITY)
Admission: EM | Admit: 2015-12-10 | Discharge: 2015-12-11 | Disposition: A | Discharge status: Home / Self Care | Payer: Medicare Other | Attending: Emergency Medicine | Admitting: Emergency Medicine

## 2015-12-10 ENCOUNTER — Emergency Department (HOSPITAL_COMMUNITY): Payer: Medicare Other

## 2015-12-10 DIAGNOSIS — R197 Diarrhea, unspecified: Secondary | ICD-10-CM | POA: Insufficient documentation

## 2015-12-10 DIAGNOSIS — M069 Rheumatoid arthritis, unspecified: Secondary | ICD-10-CM | POA: Insufficient documentation

## 2015-12-10 DIAGNOSIS — R112 Nausea with vomiting, unspecified: Secondary | ICD-10-CM | POA: Diagnosis not present

## 2015-12-10 DIAGNOSIS — Z87448 Personal history of other diseases of urinary system: Secondary | ICD-10-CM | POA: Diagnosis not present

## 2015-12-10 DIAGNOSIS — Z8711 Personal history of peptic ulcer disease: Secondary | ICD-10-CM | POA: Insufficient documentation

## 2015-12-10 DIAGNOSIS — Z8619 Personal history of other infectious and parasitic diseases: Secondary | ICD-10-CM

## 2015-12-10 DIAGNOSIS — R1013 Epigastric pain: Secondary | ICD-10-CM | POA: Diagnosis present

## 2015-12-10 DIAGNOSIS — R000 Tachycardia, unspecified: Secondary | ICD-10-CM | POA: Diagnosis not present

## 2015-12-10 DIAGNOSIS — Z8719 Personal history of other diseases of the digestive system: Secondary | ICD-10-CM | POA: Insufficient documentation

## 2015-12-10 DIAGNOSIS — Z8679 Personal history of other diseases of the circulatory system: Secondary | ICD-10-CM | POA: Insufficient documentation

## 2015-12-10 DIAGNOSIS — R1033 Periumbilical pain: Secondary | ICD-10-CM | POA: Diagnosis not present

## 2015-12-10 DIAGNOSIS — Z79899 Other long term (current) drug therapy: Secondary | ICD-10-CM | POA: Insufficient documentation

## 2015-12-10 HISTORY — DX: Unspecified cirrhosis of liver: K74.60

## 2015-12-10 HISTORY — DX: Traumatic subdural hemorrhage with loss of consciousness status unknown, initial encounter: S06.5XAA

## 2015-12-10 HISTORY — DX: Traumatic subdural hemorrhage with loss of consciousness of unspecified duration, initial encounter: S06.5X9A

## 2015-12-10 LAB — LIPASE, BLOOD: LIPASE: 57 U/L — AB (ref 11–51)

## 2015-12-10 LAB — URINE MICROSCOPIC-ADD ON

## 2015-12-10 LAB — COMPREHENSIVE METABOLIC PANEL
ALBUMIN: 2.2 g/dL — AB (ref 3.5–5.0)
ALT: 15 U/L (ref 14–54)
ANION GAP: 6 (ref 5–15)
AST: 31 U/L (ref 15–41)
Alkaline Phosphatase: 66 U/L (ref 38–126)
BUN: 7 mg/dL (ref 6–20)
CHLORIDE: 111 mmol/L (ref 101–111)
CO2: 26 mmol/L (ref 22–32)
Calcium: 8.9 mg/dL (ref 8.9–10.3)
Creatinine, Ser: 0.5 mg/dL (ref 0.44–1.00)
GFR calc Af Amer: >60 mL/min (ref >60)
GFR calc non Af Amer: >60 mL/min (ref >60)
GLUCOSE: 137 mg/dL — AB (ref 65–99)
POTASSIUM: 3 mmol/L — AB (ref 3.5–5.1)
SODIUM: 143 mmol/L (ref 135–145)
Total Bilirubin: 0.7 mg/dL (ref 0.3–1.2)
Total Protein: 5.9 g/dL — ABNORMAL LOW (ref 6.5–8.1)

## 2015-12-10 LAB — URINALYSIS, ROUTINE W REFLEX MICROSCOPIC
BILIRUBIN URINE: NEGATIVE
Glucose, UA: NEGATIVE mg/dL
Ketones, ur: NEGATIVE mg/dL
Nitrite: NEGATIVE
Protein, ur: NEGATIVE mg/dL
SPECIFIC GRAVITY, URINE: 1.013 (ref 1.005–1.030)
pH: 7 (ref 5.0–8.0)

## 2015-12-10 LAB — I-STAT CG4 LACTIC ACID, ED
LACTIC ACID, VENOUS: 1.27 mmol/L (ref 0.5–2.0)
Lactic Acid, Venous: 4.04 mmol/L (ref 0.5–2.0)

## 2015-12-10 LAB — CBC
HEMATOCRIT: 31.3 % — AB (ref 36.0–46.0)
HEMOGLOBIN: 9 g/dL — AB (ref 12.0–15.0)
MCH: 22.1 pg — AB (ref 26.0–34.0)
MCHC: 28.8 g/dL — AB (ref 30.0–36.0)
MCV: 76.7 fL — AB (ref 78.0–100.0)
Platelets: 153 10*3/uL (ref 150–400)
RBC: 4.08 MIL/uL (ref 3.87–5.11)
RDW: 21.1 % — ABNORMAL HIGH (ref 11.5–15.5)
WBC: 6 10*3/uL (ref 4.0–10.5)

## 2015-12-10 LAB — POC OCCULT BLOOD, ED: FECAL OCCULT BLD: NEGATIVE

## 2015-12-10 MED ORDER — IOHEXOL 300 MG/ML  SOLN
80.0000 mL | Freq: Once | INTRAMUSCULAR | Status: AC | PRN
  Administered 2015-12-10: 80 mL via INTRAVENOUS

## 2015-12-10 MED ORDER — ONDANSETRON HCL 4 MG/2ML IJ SOLN
4.0000 mg | Freq: Once | INTRAMUSCULAR | Status: AC | PRN
  Administered 2015-12-11: 4 mg via INTRAVENOUS
  Filled 2015-12-10: qty 2

## 2015-12-10 MED ORDER — ONDANSETRON HCL 4 MG/2ML IJ SOLN
4.0000 mg | Freq: Once | INTRAMUSCULAR | Status: AC | PRN
  Administered 2015-12-10: 4 mg via INTRAVENOUS
  Filled 2015-12-10: qty 2

## 2015-12-10 MED ORDER — MORPHINE SULFATE (PF) 4 MG/ML IV SOLN
4.0000 mg | Freq: Once | INTRAVENOUS | Status: AC
  Administered 2015-12-10: 4 mg via INTRAVENOUS
  Filled 2015-12-10: qty 1

## 2015-12-10 MED ORDER — SODIUM CHLORIDE 0.9 % IV SOLN: Freq: Once | INTRAVENOUS | Status: DC

## 2015-12-10 MED ORDER — HYDROMORPHONE HCL 1 MG/ML IJ SOLN
1.0000 mg | Freq: Once | INTRAMUSCULAR | Status: AC
  Administered 2015-12-10: 1 mg via INTRAVENOUS
  Filled 2015-12-10: qty 1

## 2015-12-10 MED ORDER — HYDROMORPHONE HCL 1 MG/ML IJ SOLN
1.0000 mg | INTRAMUSCULAR | Status: DC | PRN
  Administered 2015-12-11 (×2): 1 mg via INTRAVENOUS
  Filled 2015-12-10 (×2): qty 1

## 2015-12-10 MED ORDER — SODIUM CHLORIDE 0.9 % IV BOLUS (SEPSIS)
1000.0000 mL | Freq: Once | INTRAVENOUS | Status: AC
  Administered 2015-12-10: 1000 mL via INTRAVENOUS

## 2015-12-10 NOTE — ED Notes (Addendum)
Hx c-diff, d/c'd from [facility] 2 weeks ago after being treated for c-diff, MRSA, continues to have diarrhea every day- "at least 10 times a day" -- was not able to get to [facility]. -- has spent last 10 days in jail in [place], unable to get treatment there.

## 2015-12-10 NOTE — ED Provider Notes (Signed)
CSN: [id number]     Arrival date & time 12/10/15  1659 History   First MD Initiated Contact with Patient 12/10/15 1807     Chief Complaint  Patient presents with  . Abdominal Pain  . c diff   . Diarrhea     (Consider location/radiation/quality/duration/timing/severity/associated sxs/prior Treatment) HPI   Andrea Rodriguez is a 59 y.o. female, with a history of Hepatitis C, Perforated pepcid ulcer, cirrhosis of the liver, presenting to the ED with epigastric/peri-umbilical abdominal pain accompanied by nausea, vomiting, and diarrhea. Pt adds that she had at least 10 stools a day, some of which are green and some are black and tarry. Pt was just released 2 weeks ago from [facility] where she was hospitalized for 10 days for C-diff, pancreatitis, and MRSA. Pt was then in jail for the last ten days. Pt rates her abdominal pain at 10/10, burning/stabbing, non-radiating, constant since before she was admitted to [facility] a month ago. Pt denies alcohol, drug, or tobacco use.    Past Medical History  Diagnosis Date  . Rheumatoid arthritis(714.0)   . PUD (peptic ulcer disease)   . Hepatitis C   . Lyme disease   . Renal disorder   . Cirrhosis (HCC)   . Subdural hematoma [facility])    Past Surgical History  Procedure Laterality Date  . Joint replacement     No family history on file. Social History  Substance Use Topics  . Smoking status: Former Smoker    Types: Cigarettes  . Smokeless tobacco: Never Used  . Alcohol Use: 1.2 oz/week    2 Standard drinks or equivalent per week     Comment: occasional   OB History    No data available     Review of Systems  Constitutional: Negative for fever, chills, diaphoresis and unexpected weight change.  Respiratory: Negative for cough, chest tightness and shortness of breath.   Cardiovascular: Negative for chest pain, palpitations and leg swelling.  Gastrointestinal: Positive for nausea, vomiting, abdominal pain and diarrhea.  Negative for constipation.  Genitourinary: Negative for dysuria and flank pain.  Musculoskeletal: Negative for back pain.  Skin: Negative for color change and pallor.  Neurological: Negative for dizziness, syncope, weakness and light-headedness.  All other systems reviewed and are negative.     Allergies  Cortisone; Erythromycin base; Hydrocodone-acetaminophen; Ibuprofen; Ketorolac; Nsaids; Penicillins; Tramadol; Tylenol; Azithromycin; and Toradol  Home Medications   Prior to Admission medications   Medication Sig Start Date End Date Taking? Authorizing Provider  albuterol (PROVENTIL) (2.5 MG/3ML) 0.083% nebulizer solution Take 3 mLs (2.5 mg total) by nebulization every 6 (six) hours as needed for wheezing or shortness of breath. 07/03/15  Yes Blake Divine, MD  Calcium Carb-Cholecalciferol (CALCIUM-VITAMIN D3) 600-400 MG-UNIT CAPS Take 1 tablet by mouth daily.   Yes Historical Provider, MD  cholecalciferol (VITAMIN D) 1000 UNITS tablet Take 1,000 Units by mouth daily.   Yes Historical Provider, MD  lactulose (CHRONULAC) 10 GM/15ML solution Take 10 g by mouth daily as needed for moderate constipation.    Yes Historical Provider, MD  magnesium oxide (MAG-OX) 400 MG tablet Take 800 mg by mouth 2 (two) times daily.   Yes Historical Provider, MD  methimazole (TAPAZOLE) 5 MG tablet Take 5 mg by mouth 3 (three) times daily.   Yes Historical Provider, MD  Multiple Vitamin (MULTIVITAMIN WITH MINERALS) TABS tablet Take 1 tablet by mouth daily.   Yes Historical Provider, MD  oxyCODONE (ROXICODONE) 15 MG immediate release tablet Take  15 mg by mouth 5 (five) times daily.  01/12/15  Yes Historical Provider, MD  pantoprazole (PROTONIX) 40 MG tablet Take 40 mg by mouth 2 (two) times daily.   Yes Historical Provider, MD  traZODone (DESYREL) 50 MG tablet Take 50 mg by mouth at bedtime.   Yes Historical Provider, MD   BP 128/85 mmHg  Pulse 111  Temp(Src) 99.6 F (37.6 C) (Oral)  Resp 16  SpO2  92% Physical Exam  Constitutional: She appears well-developed and well-nourished. No distress.  HENT:  Head: Normocephalic and atraumatic.  Eyes: Conjunctivae are normal. Pupils are equal, round, and reactive to light.  Cardiovascular: Regular rhythm and normal heart sounds.  Tachycardia present.   Pulmonary/Chest: Effort normal and breath sounds normal. No respiratory distress.  Abdominal: Soft. Normal appearance and bowel sounds are normal. There is tenderness in the epigastric area and periumbilical area. There is no CVA tenderness.  Genitourinary: Rectum normal. Rectal exam shows no mass and no tenderness.  Rectal exam performed. Hemoccult negative. RN Nehemiah Settle served as Biomedical engineer.  Musculoskeletal: She exhibits no edema or tenderness.  Neurological: She is alert.  Skin: Skin is warm and dry. She is not diaphoretic.  Nursing note and vitals reviewed.   ED Course  Procedures (including critical care time) Labs Review Labs Reviewed  LIPASE, BLOOD - Abnormal; Notable for the following:    Lipase 57 (*)    All other components within normal limits  COMPREHENSIVE METABOLIC PANEL - Abnormal; Notable for the following:    Potassium 3.0 (*)    Glucose, Bld 137 (*)    Total Protein 5.9 (*)    Albumin 2.2 (*)    All other components within normal limits  CBC - Abnormal; Notable for the following:    Hemoglobin 9.0 (*)    HCT 31.3 (*)    MCV 76.7 (*)    MCH 22.1 (*)    MCHC 28.8 (*)    RDW 21.1 (*)    All other components within normal limits  URINALYSIS, ROUTINE W REFLEX MICROSCOPIC (NOT AT [facility]) - Abnormal; Notable for the following:    APPearance TURBID (*)    Hgb urine dipstick TRACE (*)    Leukocytes, UA MODERATE (*)    All other components within normal limits  URINE MICROSCOPIC-ADD ON - Abnormal; Notable for the following:    Squamous Epithelial / LPF 0-5 (*)    Bacteria, UA MANY (*)    Casts GRANULAR CAST (*)    Crystals CA OXALATE CRYSTALS (*)    All other components  within normal limits  I-STAT CG4 LACTIC ACID, ED - Abnormal; Notable for the following:    Lactic Acid, Venous 4.04 (*)    All other components within normal limits  C DIFFICILE QUICK SCREEN W PCR REFLEX  CULTURE, BLOOD (ROUTINE X 2)  CULTURE, BLOOD (ROUTINE X 2)  URINE CULTURE  POC OCCULT BLOOD, ED  I-STAT CG4 LACTIC ACID, ED    Imaging Review Ct Abdomen Pelvis W Contrast  12/10/2015  CLINICAL DATA:  Upper and mid lower abdominal pain EXAM: CT ABDOMEN AND PELVIS WITH CONTRAST TECHNIQUE: Multidetector CT imaging of the abdomen and pelvis was performed using the standard protocol following bolus administration of intravenous contrast. CONTRAST:  34mL OMNIPAQUE IOHEXOL 300 MG/ML  SOLN COMPARISON:  None. FINDINGS: Lower chest: Atelectasis or scar noted in the right middle lobe and lingula. No pleural fluid identified. Hepatobiliary: The liver is cirrhotic. 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 No focal liver abnormality. The gallbladder scratch set mild gallbladder wall thickening. There is a stone within the cystic duct measuring 6 mm. The extrahepatic bile duct is increased in caliber measuring 1 cm, image 48 of series 5. Pancreas: Negative. Spleen: The spleen measures 11 cm in length. Adrenals/Urinary Tract: Normal appearance of the adrenal glands. The kidneys are unremarkable. Urinary bladder is normal. Stomach/Bowel: The stomach is normal. The small bowel loops have a normal course and caliber. No pathologic dilatation of the large or small bowel loops. Vascular/Lymphatic: Calcified atherosclerotic disease involves the abdominal aorta. No aneurysm. No enlarged retroperitoneal or mesenteric adenopathy. No enlarged pelvic or inguinal lymph nodes. Reproductive: Partially calcified fibroid uterus noted. Other: There is no ascites or focal fluid collections within the abdomen or pelvis. Musculoskeletal: Degenerative disc disease is noted within the  lumbar spine. Cystic degenerative change involves both hips. IMPRESSION: 1. No acute findings identified within the abdomen or pelvis. 2. Morphologic features of the liver compatible with cirrhosis. 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 3. Small scratch sec gallstone noted within the cystic duct. There is mild increase caliber of the extrahepatic bile duct without evidence for intrahepatic bile duct dilatation. 4. Aortic atherosclerosis. 5. Fibroid uterus. Electronically Signed   By: Signa Kell M.D.   On: 12/10/2015 21:35   I have personally reviewed and evaluated these images and lab results as part of my medical decision-making.   EKG Interpretation None      MDM   Final diagnoses:  Periumbilical abdominal pain  Non-intractable vomiting with nausea, vomiting of unspecified type  Diarrhea, unspecified type  H/O Clostridium difficile infection    Nature V Gaida presents with abdominal pain, nausea, vomiting, and diarrhea for over a month with a recent diagnosis and treatment for C-Diff.  Findings and plan of care discussed with Leta Baptist, MD.  Suspect this patient may still have C-Diff. Plan to treat patient's pain and nausea, test stool, and CT Abdomen. Patient is hemoccult negative. Elevated lactic acid and slightly elevated lipase.   1:14 AM Upon Dr. Weyman Rodney evaluation of the patient, she is sitting upright drinking a sprite. 1:19 AM Consult to General Surgery due to patient's complaint combined with her CT findings. Spoke to Dr. Guerry Minors nurse, who was with him in the OR and relayed the report I gave her in real time. Dr. Sheliah Hatch said he would come down and see the patient when he is done in the OR.  1:59 AM End of shift patient care handoff report given to Elpidio Anis, PA-C. Plan is to follow the advice of Surgery. If they do not think it's surgical and patient is still tolerating PO, she can be discharged to follow up with PCP. Pt was also updated on this plan of  care.  Anselm Pancoast, PA-C 12/11/15 0207  Leta Baptist, MD 12/16/15 [phone]

## 2015-12-11 LAB — URINE CULTURE

## 2015-12-11 MED ORDER — ONDANSETRON HCL 4 MG/2ML IJ SOLN
4.0000 mg | Freq: Once | INTRAMUSCULAR | Status: AC
  Administered 2015-12-11: 4 mg via INTRAVENOUS
  Filled 2015-12-11: qty 2

## 2015-12-11 MED ORDER — HYDROMORPHONE HCL 1 MG/ML IJ SOLN
1.0000 mg | Freq: Once | INTRAMUSCULAR | Status: AC
  Administered 2015-12-11: 1 mg via INTRAVENOUS
  Filled 2015-12-11: qty 1

## 2015-12-11 MED ORDER — SODIUM CHLORIDE 0.9 % IV BOLUS (SEPSIS)
1000.0000 mL | Freq: Once | INTRAVENOUS | Status: AC
  Administered 2015-12-11: 1000 mL via INTRAVENOUS

## 2015-12-11 MED ORDER — ONDANSETRON HCL 4 MG PO TABS: 4.0000 mg | ORAL_TABLET | Freq: Three times a day (TID) | ORAL | Status: AC | PRN

## 2015-12-11 MED ORDER — OXYCODONE HCL 15 MG PO TABS: 15.0000 mg | ORAL_TABLET | ORAL | Status: AC | PRN

## 2015-12-11 NOTE — Discharge Instructions (Signed)

## 2015-12-11 NOTE — ED Notes (Signed)
Patient reports that her son is in [facility] on [address], and she will go to his hospital room. She reports that he is to be discharged today.

## 2015-12-12 ENCOUNTER — Emergency Department (HOSPITAL_COMMUNITY)
Admission: EM | Admit: 2015-12-12 | Discharge: 2015-12-12 | Disposition: A | Discharge status: Home / Self Care | Payer: Medicare Other | Attending: Emergency Medicine | Admitting: Emergency Medicine

## 2015-12-12 ENCOUNTER — Encounter (HOSPITAL_COMMUNITY): Payer: Self-pay | Admitting: Emergency Medicine

## 2015-12-12 DIAGNOSIS — Z8711 Personal history of peptic ulcer disease: Secondary | ICD-10-CM | POA: Insufficient documentation

## 2015-12-12 DIAGNOSIS — R109 Unspecified abdominal pain: Secondary | ICD-10-CM | POA: Diagnosis present

## 2015-12-12 DIAGNOSIS — G8929 Other chronic pain: Secondary | ICD-10-CM | POA: Insufficient documentation

## 2015-12-12 DIAGNOSIS — Z8719 Personal history of other diseases of the digestive system: Secondary | ICD-10-CM | POA: Diagnosis not present

## 2015-12-12 DIAGNOSIS — M545 Low back pain: Secondary | ICD-10-CM | POA: Diagnosis not present

## 2015-12-12 DIAGNOSIS — Z880 Allergy status to penicillin: Secondary | ICD-10-CM | POA: Diagnosis not present

## 2015-12-12 DIAGNOSIS — Z79899 Other long term (current) drug therapy: Secondary | ICD-10-CM | POA: Insufficient documentation

## 2015-12-12 DIAGNOSIS — R1084 Generalized abdominal pain: Secondary | ICD-10-CM | POA: Diagnosis not present

## 2015-12-12 DIAGNOSIS — Z8679 Personal history of other diseases of the circulatory system: Secondary | ICD-10-CM | POA: Diagnosis not present

## 2015-12-12 DIAGNOSIS — Z87448 Personal history of other diseases of urinary system: Secondary | ICD-10-CM | POA: Insufficient documentation

## 2015-12-12 DIAGNOSIS — Z8619 Personal history of other infectious and parasitic diseases: Secondary | ICD-10-CM | POA: Diagnosis not present

## 2015-12-12 DIAGNOSIS — M069 Rheumatoid arthritis, unspecified: Secondary | ICD-10-CM | POA: Insufficient documentation

## 2015-12-12 DIAGNOSIS — Z87891 Personal history of nicotine dependence: Secondary | ICD-10-CM | POA: Diagnosis not present

## 2015-12-12 LAB — COMPREHENSIVE METABOLIC PANEL
ALBUMIN: 2.1 g/dL — AB (ref 3.5–5.0)
ALK PHOS: 68 U/L (ref 38–126)
ALT: 12 U/L — AB (ref 14–54)
AST: 26 U/L (ref 15–41)
Anion gap: 8 (ref 5–15)
CALCIUM: 8.3 mg/dL — AB (ref 8.9–10.3)
CHLORIDE: 106 mmol/L (ref 101–111)
CO2: 25 mmol/L (ref 22–32)
CREATININE: 0.49 mg/dL (ref 0.44–1.00)
GFR calc non Af Amer: >60 mL/min (ref >60)
GLUCOSE: 199 mg/dL — AB (ref 65–99)
Potassium: 3.3 mmol/L — ABNORMAL LOW (ref 3.5–5.1)
SODIUM: 139 mmol/L (ref 135–145)
Total Bilirubin: 0.4 mg/dL (ref 0.3–1.2)
Total Protein: 5.1 g/dL — ABNORMAL LOW (ref 6.5–8.1)

## 2015-12-12 LAB — CBC
HCT: 30.1 % — ABNORMAL LOW (ref 36.0–46.0)
HEMOGLOBIN: 8.5 g/dL — AB (ref 12.0–15.0)
MCH: 22 pg — AB (ref 26.0–34.0)
MCHC: 28.2 g/dL — ABNORMAL LOW (ref 30.0–36.0)
MCV: 78 fL (ref 78.0–100.0)
PLATELETS: 131 10*3/uL — AB (ref 150–400)
RBC: 3.86 MIL/uL — AB (ref 3.87–5.11)
RDW: 21 % — ABNORMAL HIGH (ref 11.5–15.5)
WBC: 4.5 10*3/uL (ref 4.0–10.5)

## 2015-12-12 LAB — LIPASE, BLOOD: Lipase: 41 U/L (ref 11–51)

## 2015-12-12 MED ORDER — SODIUM CHLORIDE 0.9 % IV BOLUS (SEPSIS)
1000.0000 mL | Freq: Once | INTRAVENOUS | Status: AC
  Administered 2015-12-12: 1000 mL via INTRAVENOUS

## 2015-12-12 MED ORDER — ONDANSETRON HCL 4 MG/2ML IJ SOLN
4.0000 mg | Freq: Once | INTRAMUSCULAR | Status: AC
  Administered 2015-12-12: 4 mg via INTRAVENOUS
  Filled 2015-12-12: qty 2

## 2015-12-12 MED ORDER — OXYCODONE HCL 5 MG PO TABS
10.0000 mg | ORAL_TABLET | Freq: Once | ORAL | Status: AC
  Administered 2015-12-12: 10 mg via ORAL
  Filled 2015-12-12: qty 2

## 2015-12-12 MED ORDER — ONDANSETRON 8 MG PO TBDP: 8.0000 mg | ORAL_TABLET | Freq: Three times a day (TID) | ORAL | Status: AC | PRN

## 2015-12-12 MED ORDER — MORPHINE SULFATE (PF) 4 MG/ML IV SOLN
6.0000 mg | Freq: Once | INTRAVENOUS | Status: AC
  Administered 2015-12-12: 6 mg via INTRAVENOUS
  Filled 2015-12-12: qty 2

## 2015-12-12 NOTE — Discharge Instructions (Signed)

## 2015-12-12 NOTE — ED Notes (Signed)
Dr. Campos at bedside   

## 2015-12-12 NOTE — ED Notes (Signed)
Pt c/o abdominal pain and back pain onset Nov 1st. Pt seen here Friday for same. Pt was told that she had Gallstones and pancreatitis. Pt has been seen at [facility] and [facility] for same.

## 2015-12-12 NOTE — ED Provider Notes (Signed)
CSN: [id number]     Arrival date & time 12/12/15  0827 History   First MD Initiated Contact with Patient 12/12/15 [phone]     Chief Complaint  Patient presents with  . Abdominal Pain  . Back Pain      HPI Patient presents emergency department complaining of abdominal discomfort.  She has a history of peptic ulcer disease and underwent EGD at the end of November in [facility] which demonstrated gastric ulcer with a very small punctate perforation.  She was managed nonoperatively for this with antibiotics and PPIs.  She is continued on these.  She presents now with ongoing abdominal discomfort.  She was seen in emergency department 2 days and underwent CT imaging at that time which demonstrated no abnormalities including specifically no perforation.  She states she's continued having mild upper abdominal discomfort and pain.  She denies black or bloody stools.  She denies weakness.  She reports some nausea without vomiting.  No hematemesis.  She was seen 2 days ago and prescribed Roxicodone 15 mg tabs which she states helped but has since run out of these pain pills.  She previously was managed in a pain clinic with 60 mg OxyContin as well as 15 mg oxycodone for breakthrough pain.  Her relationship with her pain clinic failed early this fall secondary to missed appointments.  She is currently without a pain physician to manage her chronic pain which is chronic low back pain and chronic pain from her rheumatoid arthritis.  She states she is struggling to get anyone to help her with her discomfort or pain.  She denies fevers and chills.  She has no other complaints at this time.  No back pain.  No urinary complaints   Past Medical History  Diagnosis Date  . Rheumatoid arthritis(714.0)   . PUD (peptic ulcer disease)   . Hepatitis C   . Lyme disease   . Renal disorder   . Cirrhosis (HCC)   . Subdural hematoma [facility])    Past Surgical History  Procedure Laterality Date  . Joint replacement     No  family history on file. Social History  Substance Use Topics  . Smoking status: Former Smoker    Types: Cigarettes  . Smokeless tobacco: Never Used  . Alcohol Use: 1.2 oz/week    2 Standard drinks or equivalent per week     Comment: occasional   OB History    No data available     Review of Systems  All other systems reviewed and are negative.     Allergies  Cortisone; Erythromycin base; Hydrocodone-acetaminophen; Ibuprofen; Ketorolac; Nsaids; Penicillins; Tramadol; Tylenol; Azithromycin; and Toradol  Home Medications   Prior to Admission medications   Medication Sig Start Date End Date Taking? Authorizing Provider  albuterol (PROVENTIL) (2.5 MG/3ML) 0.083% nebulizer solution Take 3 mLs (2.5 mg total) by nebulization every 6 (six) hours as needed for wheezing or shortness of breath. 07/03/15  Yes Blake Divine, MD  Calcium Carb-Cholecalciferol (CALCIUM-VITAMIN D3) 600-400 MG-UNIT CAPS Take 1 tablet by mouth daily.   Yes Historical Provider, MD  cholecalciferol (VITAMIN D) 1000 UNITS tablet Take 1,000 Units by mouth daily.   Yes Historical Provider, MD  lactulose (CHRONULAC) 10 GM/15ML solution Take 10 g by mouth daily as needed for moderate constipation.    Yes Historical Provider, MD  magnesium oxide (MAG-OX) 400 MG tablet Take 800 mg by mouth 2 (two) times daily.   Yes Historical Provider, MD  methimazole (TAPAZOLE) 5 MG tablet  Take 5 mg by mouth 3 (three) times daily.   Yes Historical Provider, MD  Multiple Vitamin (MULTIVITAMIN WITH MINERALS) TABS tablet Take 1 tablet by mouth daily.   Yes Historical Provider, MD  ondansetron (ZOFRAN) 4 MG tablet Take 1 tablet (4 mg total) by mouth every 8 (eight) hours as needed for nausea or vomiting. 12/11/15  Yes Elpidio Anis, PA-C  oxyCODONE (ROXICODONE) 15 MG immediate release tablet Take 1 tablet (15 mg total) by mouth every 4 (four) hours as needed for pain. 12/11/15  Yes Shari Upstill, PA-C  pantoprazole (PROTONIX) 40 MG tablet Take 40  mg by mouth 2 (two) times daily.   Yes Historical Provider, MD  traZODone (DESYREL) 50 MG tablet Take 50 mg by mouth at bedtime.   Yes Historical Provider, MD  ondansetron (ZOFRAN ODT) 8 MG disintegrating tablet Take 1 tablet (8 mg total) by mouth every 8 (eight) hours as needed for nausea or vomiting. 12/12/15   Azalia Bilis, MD   BP 107/75 mmHg  Pulse 99  Temp(Src) 97.9 F (36.6 C) (Oral)  Resp 19  SpO2 96% Physical Exam  Constitutional: She is oriented to person, place, and time. She appears well-developed and well-nourished. No distress.  HENT:  Head: Normocephalic and atraumatic.  Eyes: EOM are normal.  Neck: Normal range of motion.  Cardiovascular: Normal rate, regular rhythm and normal heart sounds.   Pulmonary/Chest: Effort normal and breath sounds normal.  Abdominal: Soft. She exhibits no distension.  Mild generalized abdominal tenderness without guarding or rebound.  No focality to her pain.  No peritoneal signs  Musculoskeletal: Normal range of motion.  Neurological: She is alert and oriented to person, place, and time.  Skin: Skin is warm and dry.  Psychiatric: She has a normal mood and affect. Judgment normal.  Nursing note and vitals reviewed.   ED Course  Procedures (including critical care time) Labs Review Labs Reviewed  CBC - Abnormal; Notable for the following:    RBC 3.86 (*)    Hemoglobin 8.5 (*)    HCT 30.1 (*)    MCH 22.0 (*)    MCHC 28.2 (*)    RDW 21.0 (*)    Platelets 131 (*)    All other components within normal limits  COMPREHENSIVE METABOLIC PANEL - Abnormal; Notable for the following:    Potassium 3.3 (*)    Glucose, Bld 199 (*)    BUN <5 (*)    Calcium 8.3 (*)    Total Protein 5.1 (*)    Albumin 2.1 (*)    ALT 12 (*)    All other components within normal limits  LIPASE, BLOOD    Imaging Review Ct Abdomen Pelvis W Contrast  12/10/2015  CLINICAL DATA:  Upper and mid lower abdominal pain EXAM: CT ABDOMEN AND PELVIS WITH CONTRAST  TECHNIQUE: Multidetector CT imaging of the abdomen and pelvis was performed using the standard protocol following bolus administration of intravenous contrast. CONTRAST:  55mL OMNIPAQUE IOHEXOL 300 MG/ML  SOLN COMPARISON:  None. FINDINGS: Lower chest: Atelectasis or scar noted in the right middle lobe and lingula. No pleural fluid identified. Hepatobiliary: The liver is cirrhotic. 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 The portal vein appears patent measuring 1.4 cm in diameter. No focal liver abnormality. The gallbladder scratch set mild gallbladder wall thickening. There is a stone within the cystic duct measuring 6 mm. The extrahepatic bile duct is increased in caliber measuring 1 cm, image 48 of series 5. Pancreas: Negative. Spleen: The spleen measures  11 cm in length. Adrenals/Urinary Tract: Normal appearance of the adrenal glands. The kidneys are unremarkable. Urinary bladder is normal. Stomach/Bowel: The stomach is normal. The small bowel loops have a normal course and caliber. No pathologic dilatation of the large or small bowel loops. Vascular/Lymphatic: Calcified atherosclerotic disease involves the abdominal aorta. No aneurysm. No enlarged retroperitoneal or mesenteric adenopathy. No enlarged pelvic or inguinal lymph nodes. Reproductive: Partially calcified fibroid uterus noted. Other: There is no ascites or focal fluid collections within the abdomen or pelvis. Musculoskeletal: Degenerative disc disease is noted within the lumbar spine. Cystic degenerative change involves both hips. IMPRESSION: 1. No acute findings identified within the abdomen or pelvis. 2. Morphologic features of the liver compatible with cirrhosis. 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 3. Small scratch sec gallstone noted within the cystic duct. There is mild increase caliber of the extrahepatic bile duct without evidence for intrahepatic bile duct dilatation. 4. Aortic atherosclerosis. 5. Fibroid uterus. Electronically Signed   By: Signa Kell M.D.    On: 12/10/2015 21:35   I have personally reviewed and evaluated these images and lab results as part of my medical decision-making.   EKG Interpretation None      MDM   Final diagnoses:  Abdominal pain, unspecified abdominal location    Labs without significant abnormality except for mild anemia which is about baseline compared to 2 days ago.  No complaints of dark or bloody stools.  Pain treated in the emergency department.  I think her ongoing pain is best managed by her primary care physician/gastroenterologist.  She will likely need referral back to a pain clinic for ongoing chronic pain needs.  I don't feel obligated to syndrome with opioid pain medications emergency department.  I recommended over-the-counter medications for pain and have prescribed her Zofran for nausea.  She understands to return to the ER for new or worsening symptoms    Azalia Bilis, MD 12/12/15 1110

## 2015-12-12 NOTE — ED Notes (Signed)
Dr. Patria Mane at bedside with the patient during assessment.

## 2015-12-15 LAB — CULTURE, BLOOD (ROUTINE X 2)
CULTURE: NO GROWTH
Culture: NO GROWTH

## 2016-04-24 DEATH — deceased
